# Patient Record
Sex: Male | Born: 1955 | Race: White | Hispanic: No | Marital: Single | Smoking: Current every day smoker
Health system: Southern US, Community
[De-identification: ages and names within clinical notes are randomized; demographics above are authoritative.]

## PROBLEM LIST (undated history)

## (undated) DIAGNOSIS — R42 Dizziness and giddiness: Secondary | ICD-10-CM

## (undated) DIAGNOSIS — K219 Gastro-esophageal reflux disease without esophagitis: Secondary | ICD-10-CM

## (undated) DIAGNOSIS — I509 Heart failure, unspecified: Secondary | ICD-10-CM

## (undated) DIAGNOSIS — Z972 Presence of dental prosthetic device (complete) (partial): Secondary | ICD-10-CM

## (undated) DIAGNOSIS — K7689 Other specified diseases of liver: Secondary | ICD-10-CM

## (undated) DIAGNOSIS — F1020 Alcohol dependence, uncomplicated: Secondary | ICD-10-CM

## (undated) DIAGNOSIS — I10 Essential (primary) hypertension: Secondary | ICD-10-CM

## (undated) DIAGNOSIS — R911 Solitary pulmonary nodule: Secondary | ICD-10-CM

## (undated) DIAGNOSIS — J45909 Unspecified asthma, uncomplicated: Secondary | ICD-10-CM

## (undated) DIAGNOSIS — I719 Aortic aneurysm of unspecified site, without rupture: Secondary | ICD-10-CM

## (undated) DIAGNOSIS — K859 Acute pancreatitis without necrosis or infection, unspecified: Secondary | ICD-10-CM

## (undated) HISTORY — DX: Gastro-esophageal reflux disease without esophagitis: K21.9

## (undated) HISTORY — PX: FOOT FRACTURE SURGERY: SHX645

## (undated) HISTORY — PX: SHOULDER SURGERY: SHX246

---

## 2009-03-28 ENCOUNTER — Emergency Department (HOSPITAL_COMMUNITY): Admission: EM | Admit: 2009-03-28 | Discharge: 2009-03-28 | Payer: Self-pay | Admitting: Emergency Medicine

## 2009-03-30 ENCOUNTER — Emergency Department (HOSPITAL_COMMUNITY): Admission: EM | Admit: 2009-03-30 | Discharge: 2009-03-30 | Payer: Self-pay | Admitting: Emergency Medicine

## 2009-09-07 ENCOUNTER — Emergency Department (HOSPITAL_COMMUNITY): Admission: EM | Admit: 2009-09-07 | Discharge: 2009-09-07 | Payer: Self-pay | Admitting: Emergency Medicine

## 2010-04-11 LAB — BASIC METABOLIC PANEL
BUN: 13 mg/dL (ref 6–23)
Creatinine, Ser: 0.83 mg/dL (ref 0.4–1.5)
Glucose, Bld: 83 mg/dL (ref 70–99)
Potassium: 3.5 mEq/L (ref 3.5–5.1)

## 2016-12-21 ENCOUNTER — Other Ambulatory Visit: Payer: Self-pay | Admitting: Pediatrics

## 2016-12-21 ENCOUNTER — Ambulatory Visit
Admission: RE | Admit: 2016-12-21 | Discharge: 2016-12-21 | Disposition: A | Discharge status: Home / Self Care | Payer: Disability Insurance | Source: Ambulatory Visit | Attending: Pediatrics | Admitting: Pediatrics

## 2016-12-21 DIAGNOSIS — K859 Acute pancreatitis without necrosis or infection, unspecified: Secondary | ICD-10-CM | POA: Diagnosis present

## 2016-12-21 DIAGNOSIS — I708 Atherosclerosis of other arteries: Secondary | ICD-10-CM | POA: Insufficient documentation

## 2019-07-07 IMAGING — CR DG HIP (WITH OR WITHOUT PELVIS) 2-3V*L*
1 series · 3 of 3 positions shown · non-contrast
Comparison: None.

CLINICAL DATA: Pain, chronic

EXAM:
DG HIP (WITH OR WITHOUT PELVIS) 2-3V LEFT

[Series 1: dg hip unilat w or w/o pelvis 2-3 views  · non-contrast · 0.14mm/px · 3 of 3 slices shown]
[im 1/3]
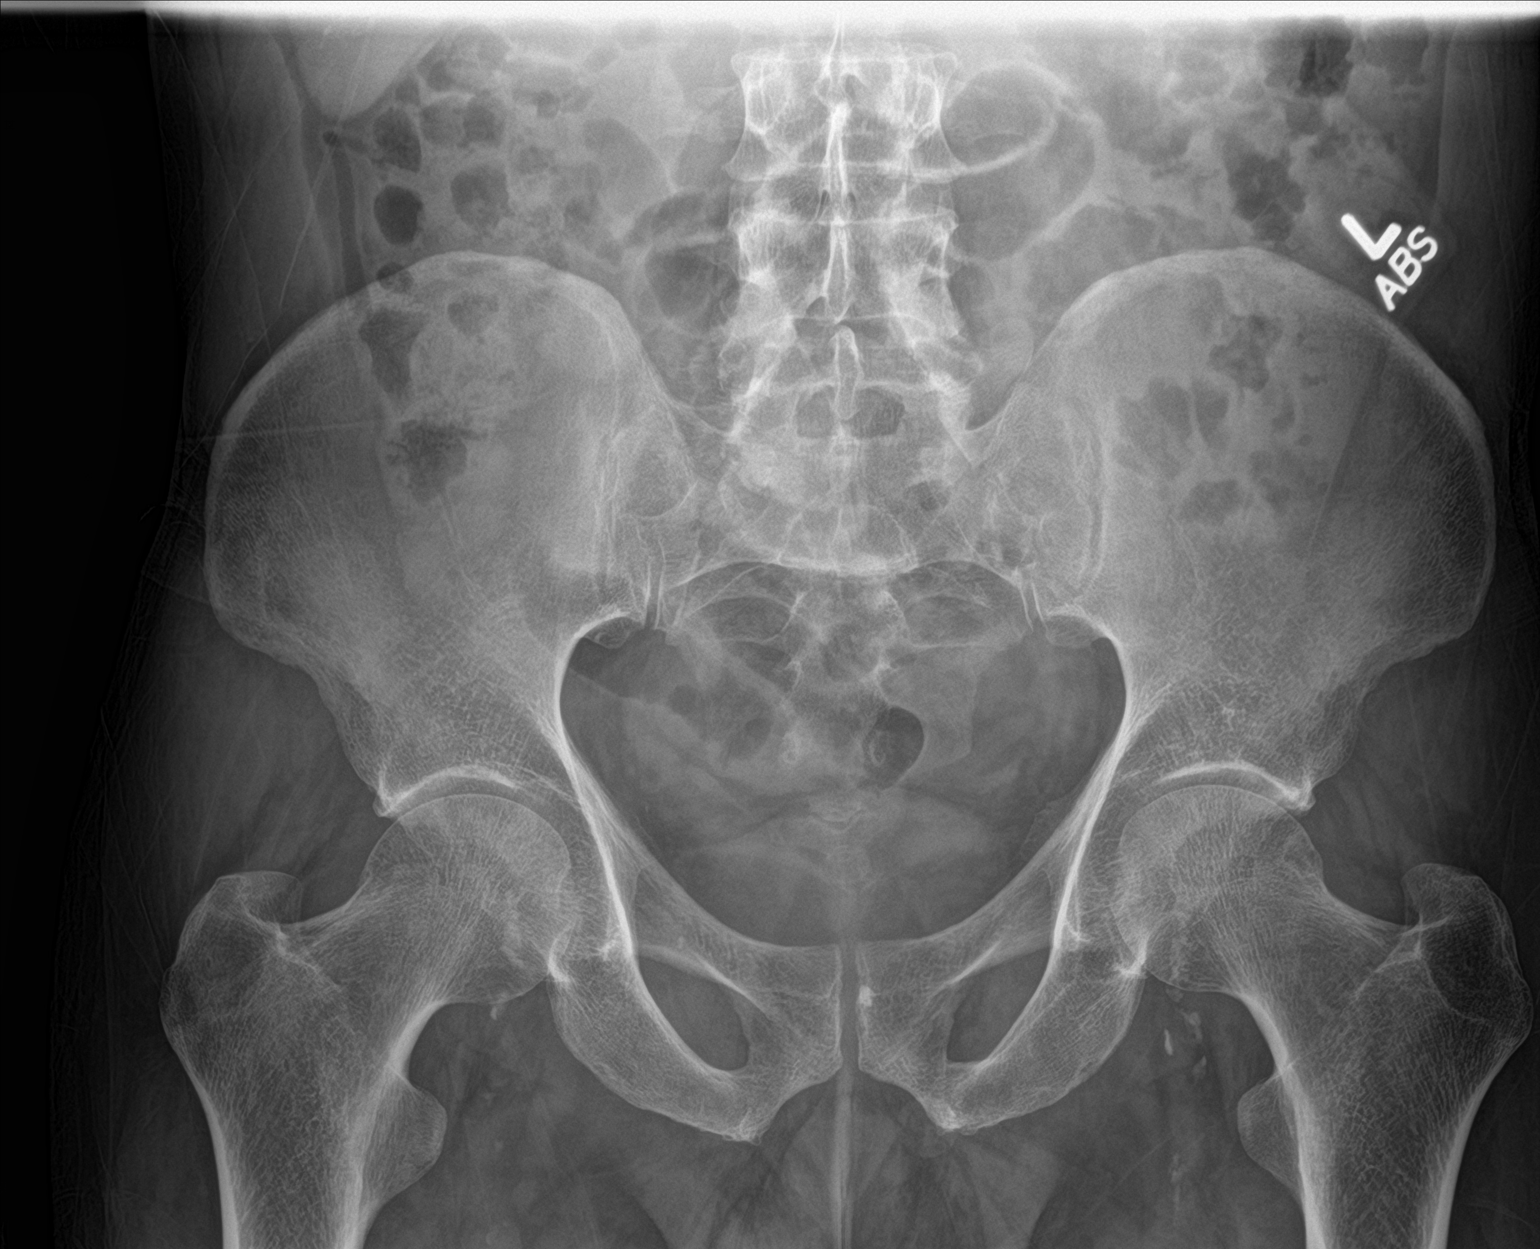
[im 2/3]
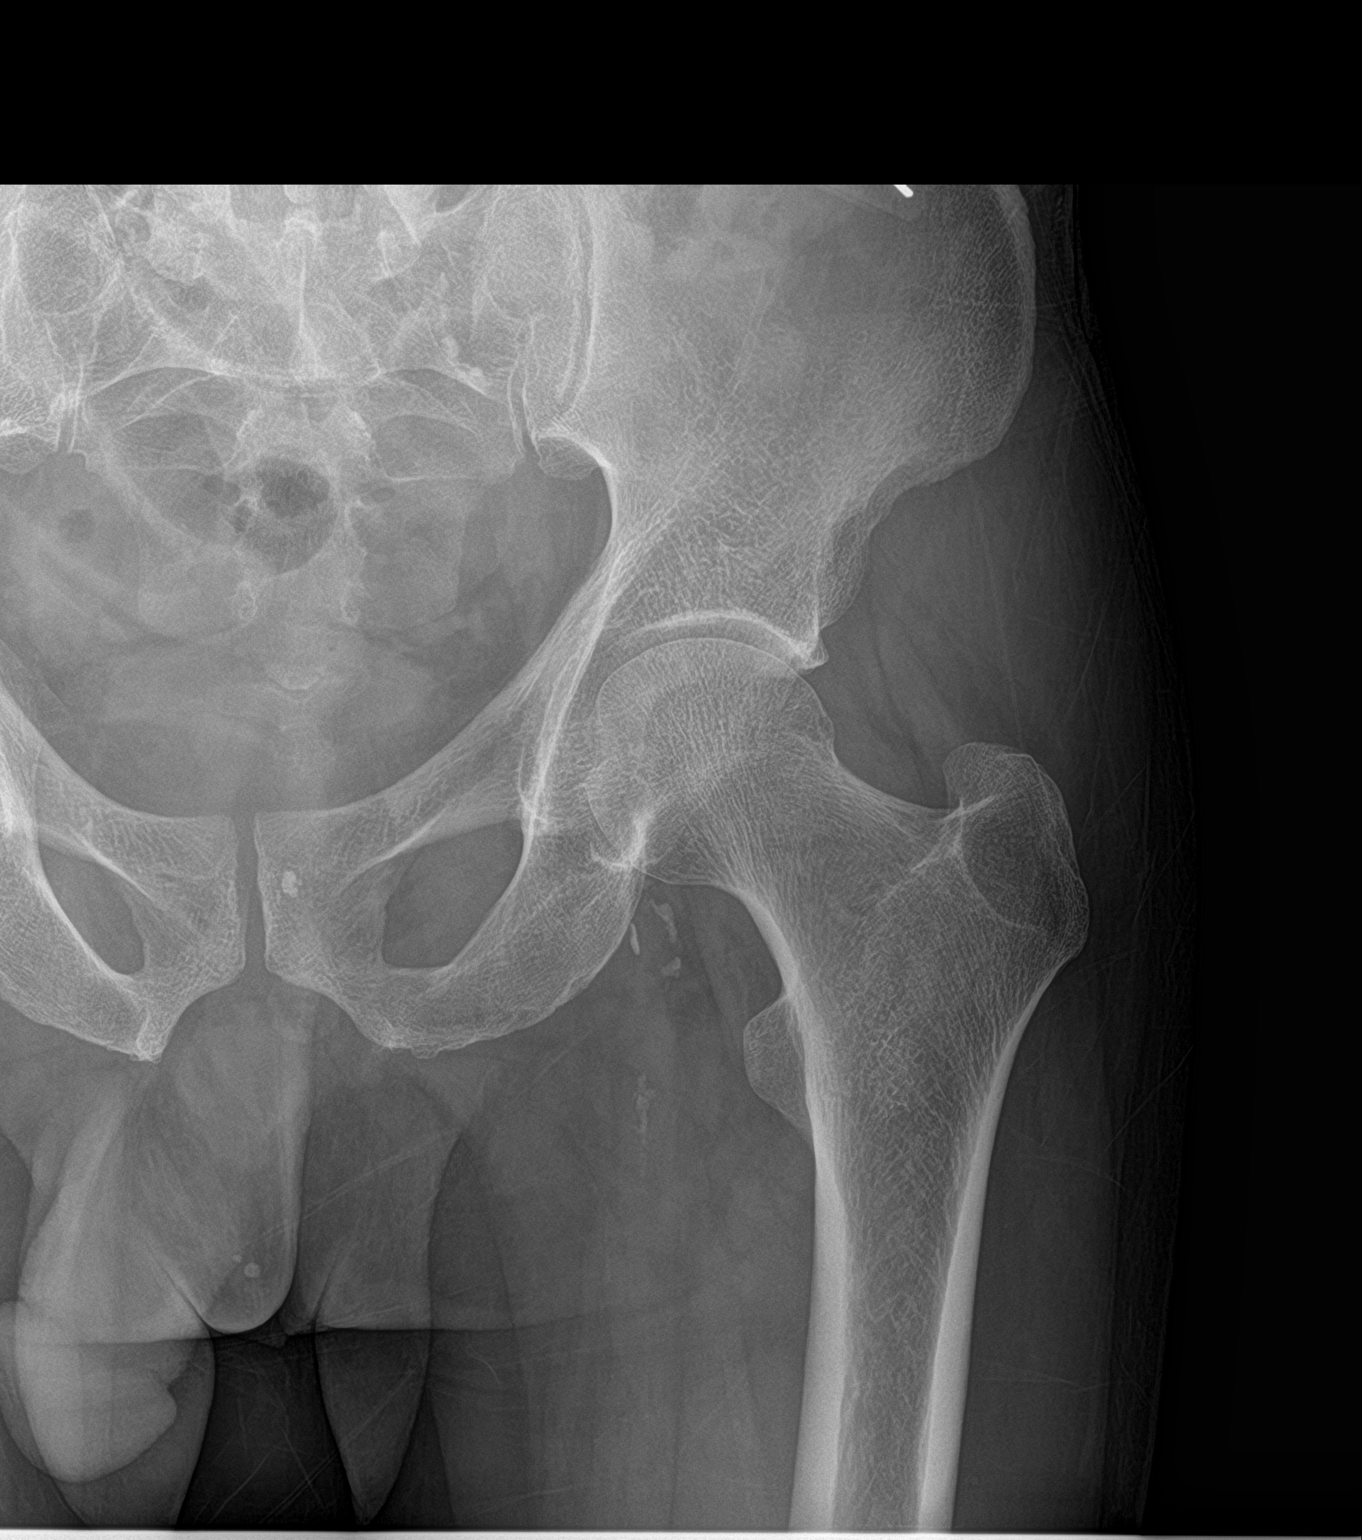
[im 3/3]
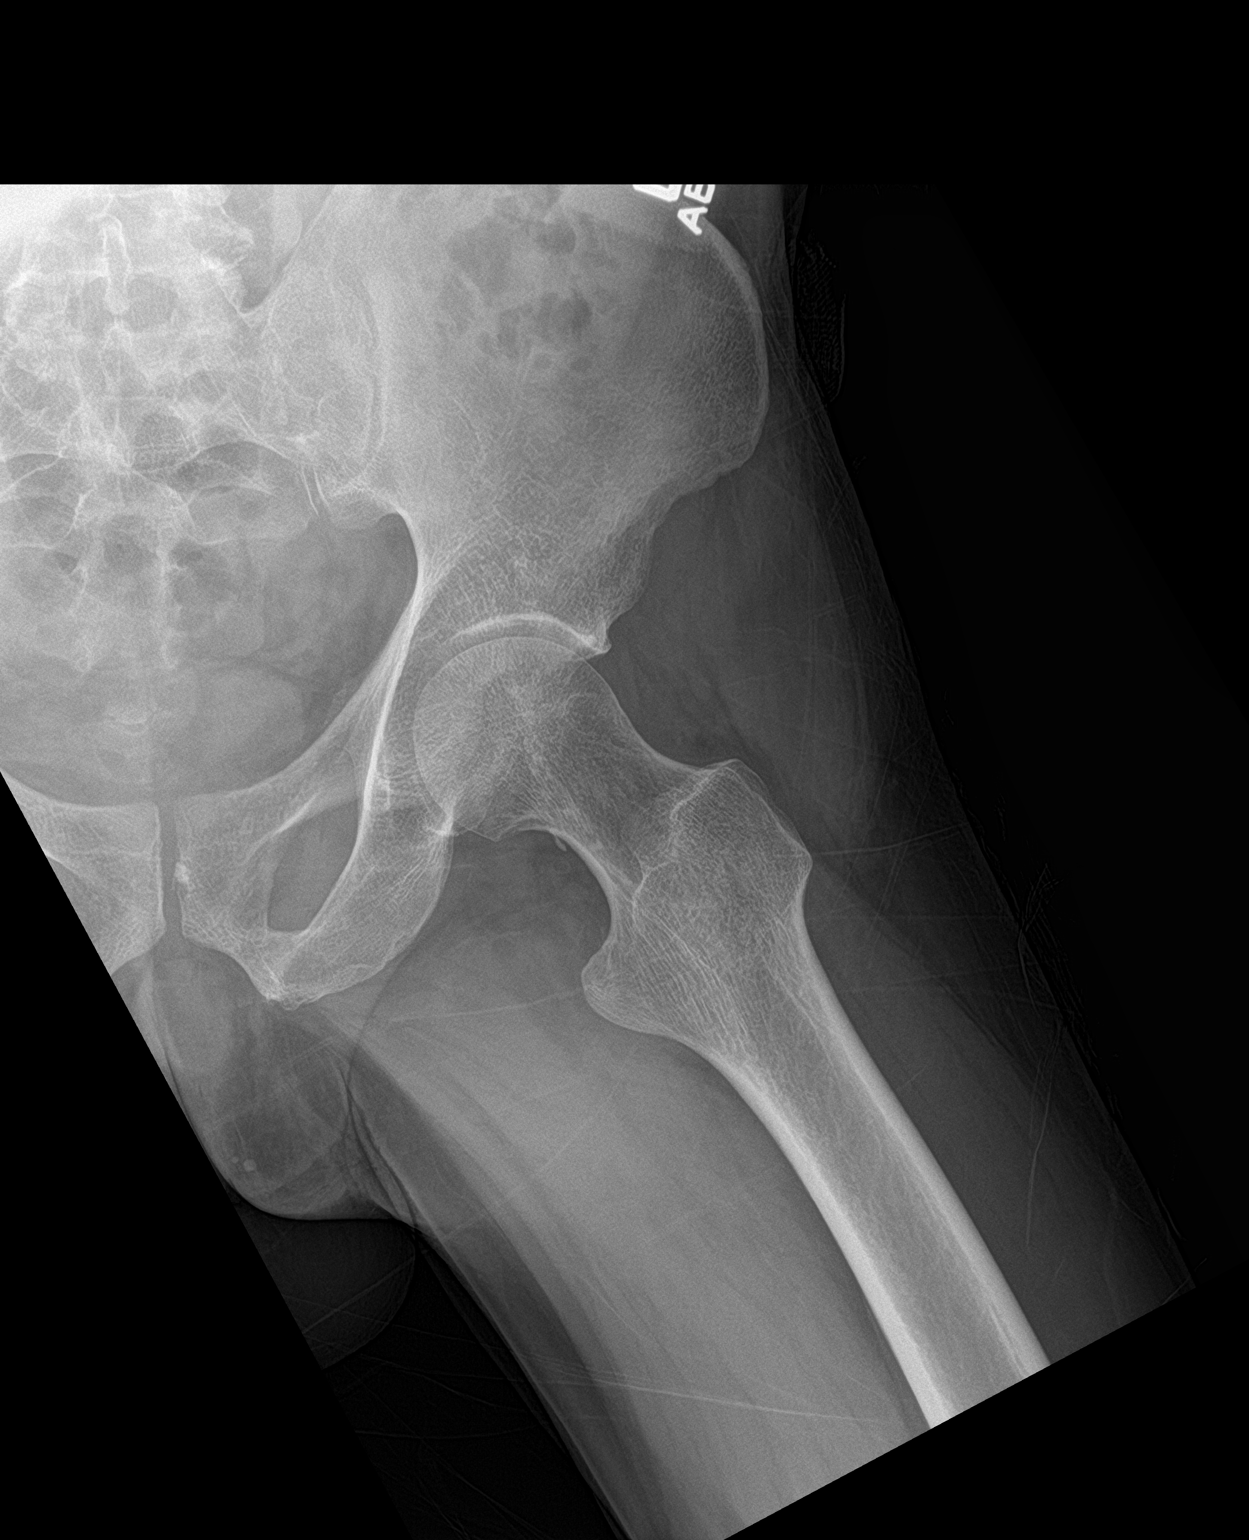

[3 of 3 positions shown; findings below may reference images not displayed]

FINDINGS: Frontal pelvis as well as frontal and lateral left hip images were
obtained. No fracture or dislocation. Joint spaces appear normal. No
erosive change. There is calcification in both proximal superficial
femoral arteries.
IMPRESSION: Areas of arterial vascular calcification. No fracture or
dislocation. No appreciable arthropathy.

## 2019-07-07 IMAGING — CR DG SHOULDER 2+V*L*
1 series · 3 of 3 positions shown · non-contrast
Comparison: None.

CLINICAL DATA: Pain

EXAM:
LEFT SHOULDER - 2+ VIEW

[Series 1: dg shoulder left · 0.14mm/px · 3 of 3 slices shown]
[im 1/3]
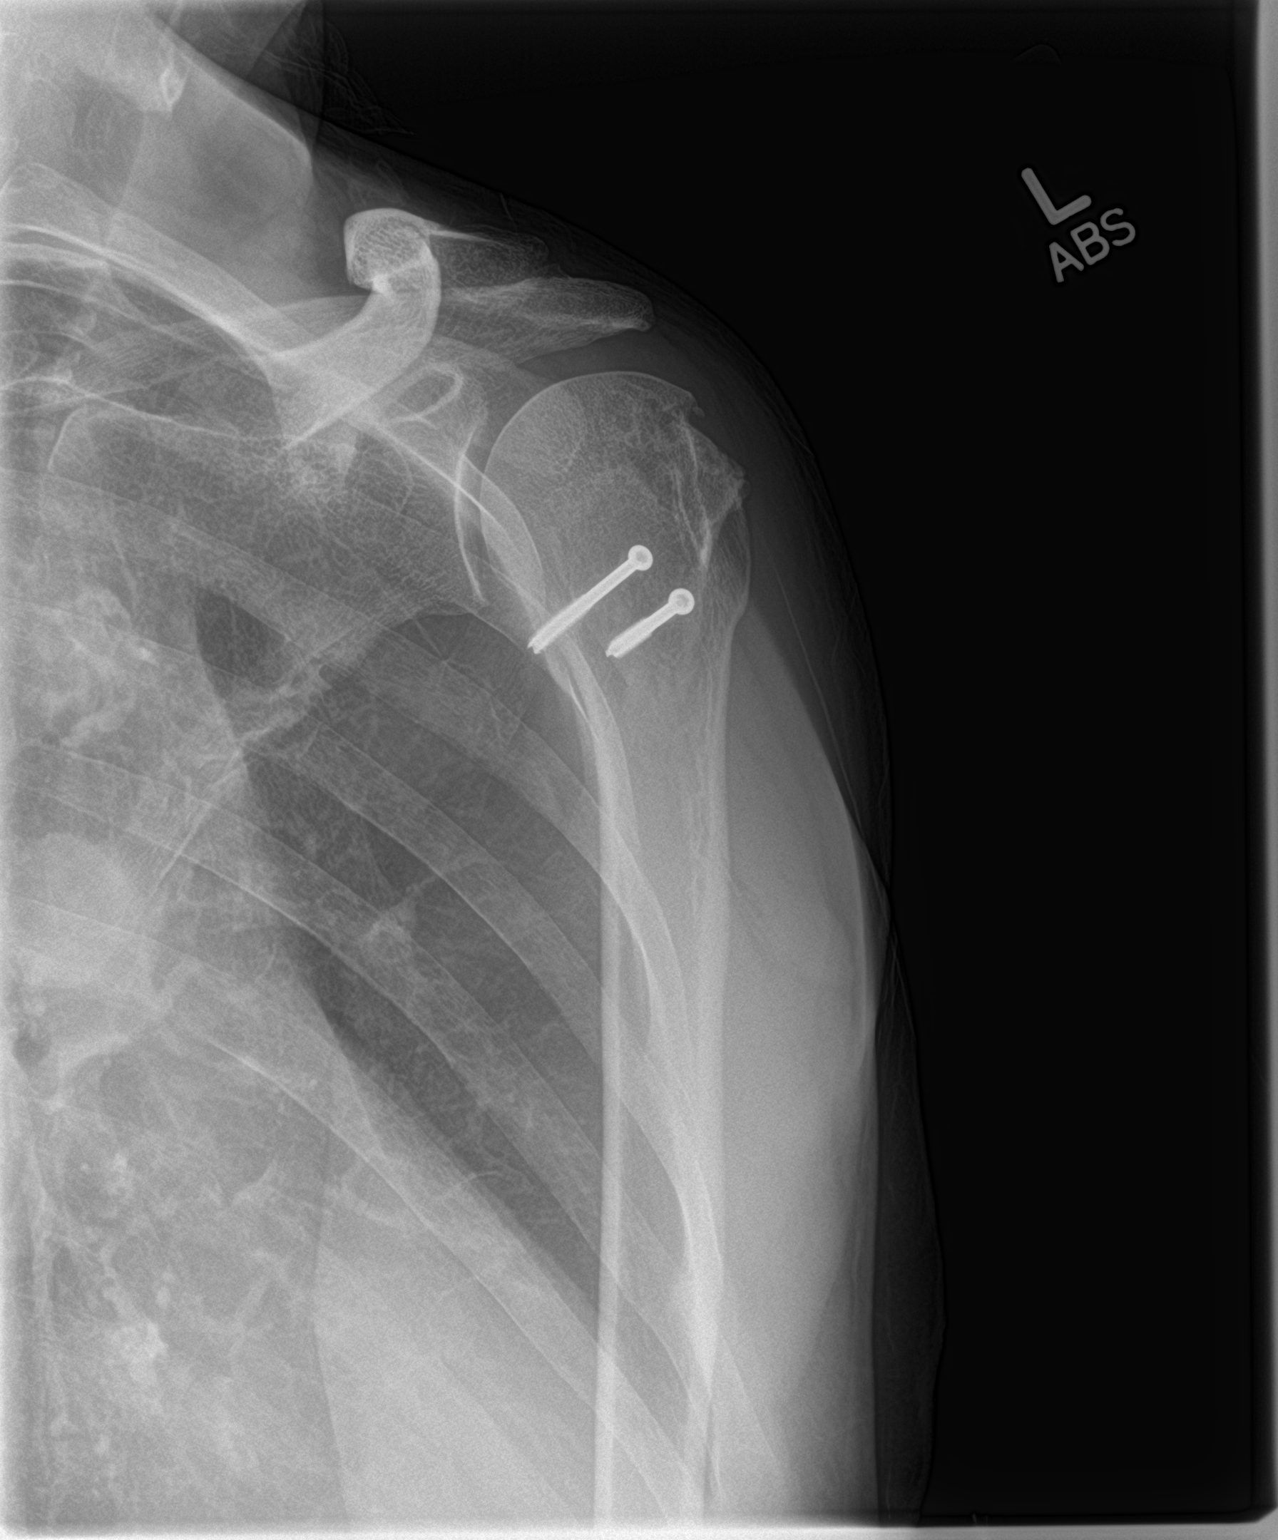
[im 2/3]
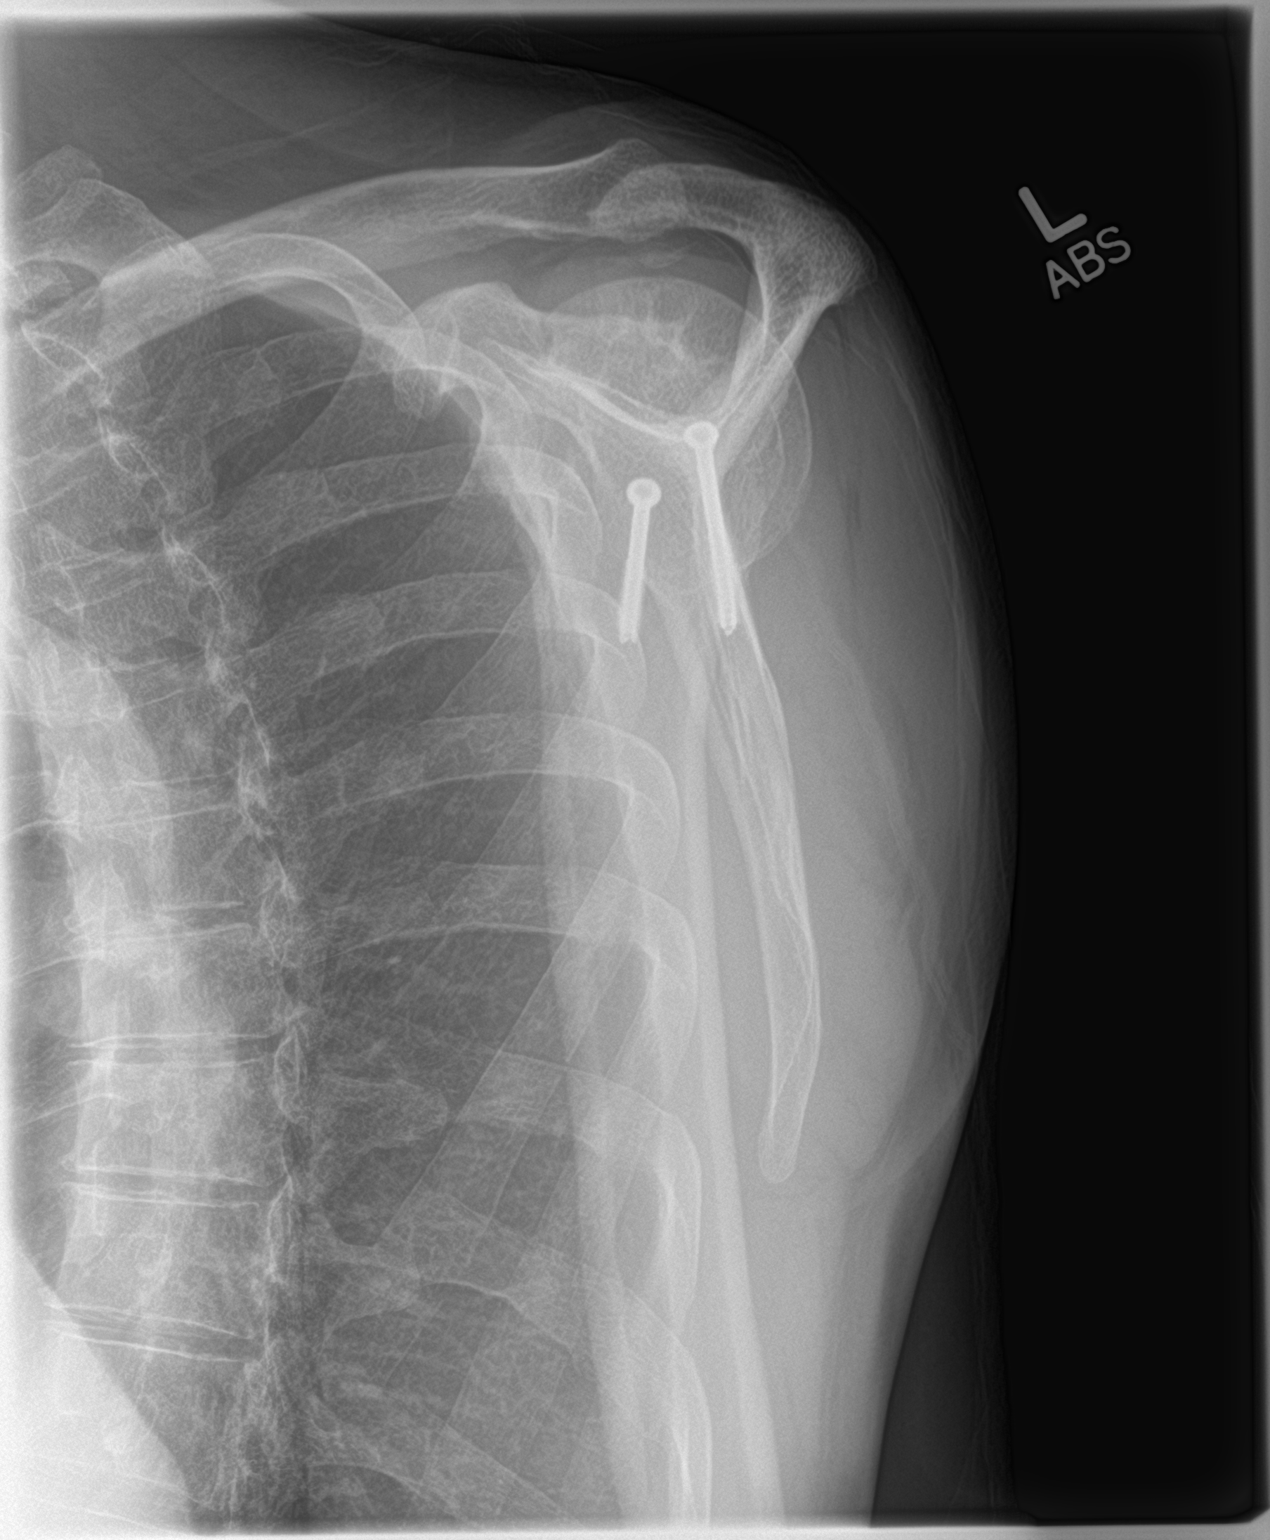
[im 3/3]
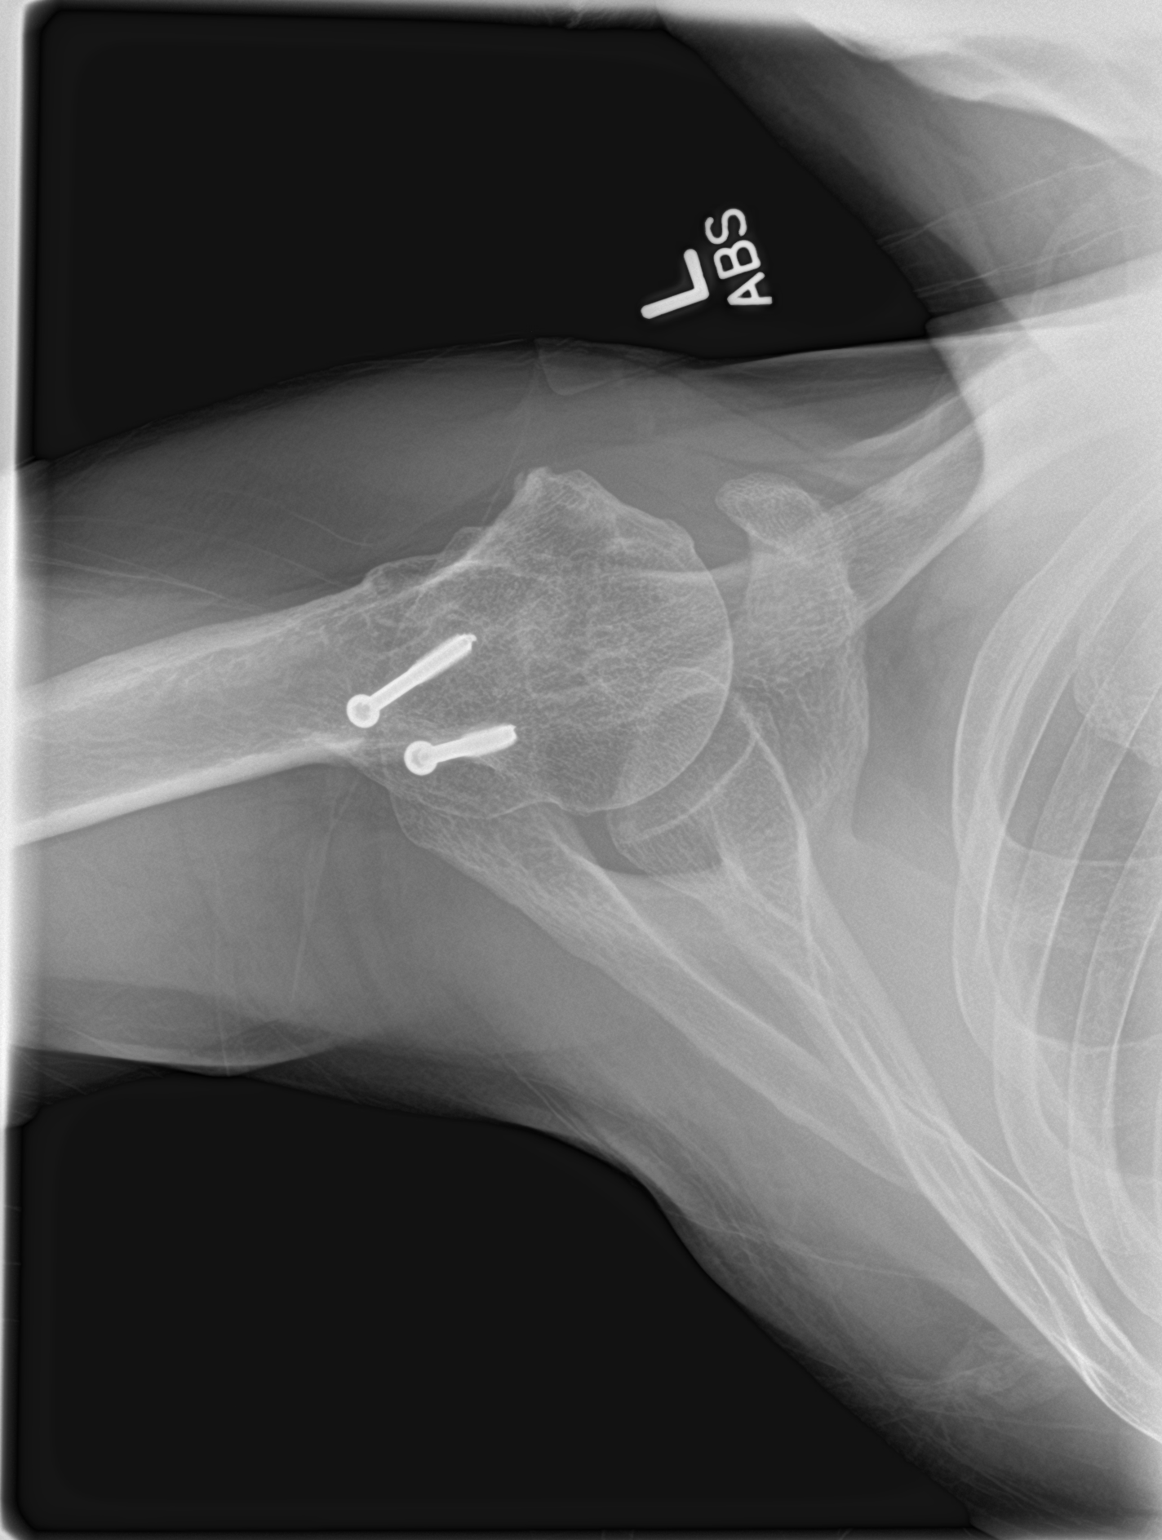

[3 of 3 positions shown; findings below may reference images not displayed]

FINDINGS: Oblique, Y scapular, and axillary images were obtained. There are
screws transfixing the proximal humeral metaphysis with alignment
anatomic. There is evidence of a prior fracture along the lateral
humeral head, likely a chronic Hill-Sachs type defect.

No acute fracture or dislocation is evident. There is no appreciable
joint space narrowing or erosion. Visualized left lung is clear.
IMPRESSION: No acute appearing fracture. No dislocation. No appreciable
arthropathy.

## 2020-10-12 ENCOUNTER — Encounter: Payer: Self-pay | Admitting: Internal Medicine

## 2020-12-01 ENCOUNTER — Ambulatory Visit: Payer: Disability Insurance | Admitting: Gastroenterology

## 2021-05-02 ENCOUNTER — Encounter: Payer: Self-pay | Admitting: Internal Medicine

## 2021-05-02 ENCOUNTER — Ambulatory Visit (INDEPENDENT_AMBULATORY_CARE_PROVIDER_SITE_OTHER): Payer: Medicare Other | Admitting: Internal Medicine

## 2021-05-02 DIAGNOSIS — F17210 Nicotine dependence, cigarettes, uncomplicated: Secondary | ICD-10-CM

## 2021-05-02 DIAGNOSIS — J449 Chronic obstructive pulmonary disease, unspecified: Secondary | ICD-10-CM | POA: Insufficient documentation

## 2021-05-02 DIAGNOSIS — R911 Solitary pulmonary nodule: Secondary | ICD-10-CM | POA: Diagnosis not present

## 2021-05-02 MED ORDER — ALBUTEROL SULFATE HFA 108 (90 BASE) MCG/ACT IN AERS: INHALATION_SPRAY | RESPIRATORY_TRACT | Status: DC

## 2021-05-02 NOTE — Assessment & Plan Note (Signed)
Counseled re importance of smoking cessation but did not meet time criteria for separate billing   ? ? ?Will need baseline pfts on return in 3 m but I don't believe he has significant copd at this point. ? ?    ?  ? ?Each maintenance medication was reviewed in detail including emphasizing most importantly the difference between maintenance and prns and under what circumstances the prns are to be triggered using an action plan format where appropriate. ? ?Total time for H and P, chart review, counseling, reviewing hfa device(s) and generating customized AVS unique to this office visit / same day charting = 45 min  ?     ?

## 2021-05-02 NOTE — Assessment & Plan Note (Signed)
CT2/19/23   8 mm GG RUL at [facility] in [place]  ? ?CT results reviewed with pt >>> Too small for PET or bx, not suspicious enough for excisional bx > really only option for now is follow the Fleischner society guidelines as rec by radiology.=  6 months reasonable, esp since it it not a solid nodule. ? ?Says plans to see a Dr Yong Channel at [facility] in meantime but doesn't know what the plan is. I suggested he keep his f/u in one system where whoever is following the lesion has access to all the CT's virtually for ease /continuity of care. ?

## 2021-05-02 NOTE — Progress Notes (Signed)
? ?Jason Brooks., male    DOB: [birth year],    MRN: [id number] ? ? ?Brief patient profile:  ?65  yowm from [place]  active smoeker  referred to pulmonary clinic in [place]  05/02/2021 by Suzzanne Cloud NP   for spn in pt  = 8 mm GG RUL at [facility] in [place] CT2/19/23  ? ? ? ? ?History of Present Illness  ?05/02/2021  Pulmonary/ 1st office eval/ Melvyn Novas / [facility]  ?Chief Complaint  ?Patient presents with  ? Consult  ?  Appt for lung nodule noted on ct scan no sob or trouble breathing. Has rescue inhaler not prescribed to patient that he uses once a day  ?Dyspnea:  walks to grocery store x 4 miles and back includes hills ?Cough: none  ?Sleep: flat bed on side/ one pillow  ?SABA use: 1st thing in am daily then nothing else the rest of the day  ? ?No obvious day to day or daytime variability or assoc excess/ purulent sputum or mucus plugs or hemoptysis or cp or chest tightness, subjective wheeze or overt sinus or hb symptoms.  ? ?Sleeping as above without nocturnal  or early am exacerbation  of respiratory  c/o's or need for noct saba. Also denies any obvious fluctuation of symptoms with weather or environmental changes or other aggravating or alleviating factors except as outlined above  ? ?No unusual exposure hx or h/o childhood pna/ asthma or knowledge of premature birth. ? ?Current Allergies, Complete Past Medical History, Past Surgical History, Family History, and Social History were reviewed in [employer] record. ? ?ROS  The following are not active complaints unless bolded ?Hoarseness, sore throat, dysphagia, dental problems, itching, sneezing,  nasal congestion or discharge of excess mucus or purulent secretions, ear ache,   fever, chills, sweats, unintended wt loss or wt gain, classically pleuritic or exertional cp,  orthopnea pnd or arm/hand swelling  or leg swelling, presyncope, palpitations, abdominal pain, anorexia, nausea, vomiting, diarrhea  or change in bowel habits or  change in bladder habits, change in stools or change in urine, dysuria, hematuria,  rash, arthralgias, visual complaints, headache, numbness, weakness or ataxia or problems with walking or coordination,  change in mood or  memory. ?      ?   ? ?History reviewed. No pertinent past medical history. ? ?Outpatient Medications Prior to Visit  ?Medication Sig Dispense Refill  ? atorvastatin (LIPITOR) 20 MG tablet Take 20 mg by mouth daily.    ?Albuterol 2pffs each am (not prn)  ? ? ?Objective:  ?  ? ?BP (!) 142/88 (BP Location: Left Arm, Patient Position: Sitting)   Pulse 62   Temp 98.7 ?F (37.1 ?C) (Temporal)   Ht '5\' 8"'$  (1.727 m)   Wt 141 lb (64 kg)   SpO2 98% Comment: ra  BMI 21.44 kg/m?  ? ?SpO2: 98 % (ra) ? ?HEENT : edentulous   ? ?NECK :  without JVD/Nodes/TM/ nl carotid upstrokes bilaterally ? ? ?LUNGS: no acc muscle use,  Min barrel  contour chest wall with bilateral  slightly decreased bs s audible wheeze and  without cough on insp or exp maneuvers and min  Hyperresonant  to  percussion bilaterally   ? ? ?CV:  RRR  no s3 or murmur or increase in P2, and no edema  ? ?ABD:  soft and nontender with pos end  insp Hoover's  in the supine position. No bruits or organomegaly appreciated, bowel sounds nl ? ?MS:  Nl gait/  ext warm without deformities, calf tenderness, cyanosis or clubbing ?No obvious joint restrictions  ? ?SKIN: warm and dry without lesions   ? ?NEURO:  alert, approp, nl sensorium with  no motor or cerebellar deficits apparent.  ?    ? ? ?   ?Assessment  ? ?COPD GOLD?  / active smoker  ?Active smoker / Group A symtpoms ?- 05/02/2021  After extensive coaching inhaler device,  effectiveness =    80% so continue saba prn ? ?Only use your albuterol as a rescue medication to be used if you can't catch your breath by resting or doing a relaxed purse lip breathing pattern.  ?- The less you use it, the better it will work when you need it. ?- Ok to use up to 2 puffs  every 4 hours if you must but call for  immediate appointment if use goes up over your usual need ?- Don't leave home without it !!  (think of it like the spare tire for your car)  ? ?rec also Ok to try albuterol 15 min before an activity (on alternating days)  that you know would usually make you short of breath and see if it makes any difference and if makes none then don't take albuterol after activity unless you can't catch your breath as this means it's the resting that helps, not the albuterol. ?     ? ? ? ?Solitary pulmonary nodule on lung CT ?CT2/19/23   8 mm GG RUL at [facility] in [place]  ? ?CT results reviewed with pt >>> Too small for PET or bx, not suspicious enough for excisional bx > really only option for now is follow the Fleischner society guidelines as rec by radiology.=  6 months reasonable, esp since it it not a solid nodule. ? ?Says plans to see a Dr Yong Channel at [facility] in meantime but doesn't know what the plan is. I suggested he keep his f/u in one system where whoever is following the lesion has access to all the CT's virtually for ease /continuity of care. ? ? ? ?Cigarette smoker ?Counseled re importance of smoking cessation but did not meet time criteria for separate billing   ? ? ?Will need baseline pfts on return in 3 m but I don't believe he has significant copd at this point. ? ?    ? ?Each maintenance medication was reviewed in detail including emphasizing most importantly the difference between maintenance and prns and under what circumstances the prns are to be triggered using an action plan format where appropriate. ? ?Total time for H and P, chart review, counseling, reviewing hfa device(s) and generating customized AVS unique to this office visit / same day charting = 45 min  ?     ? ? ?Christinia Gully, MD ?05/02/2021 ?   ?

## 2021-05-02 NOTE — Assessment & Plan Note (Signed)
Active smoker / Group A symtpoms ?- 05/02/2021  After extensive coaching inhaler device,  effectiveness =    80% so continue saba prn ? ?Only use your albuterol as a rescue medication to be used if you can't catch your breath by resting or doing a relaxed purse lip breathing pattern.  ?- The less you use it, the better it will work when you need it. ?- Ok to use up to 2 puffs  every 4 hours if you must but call for immediate appointment if use goes up over your usual need ?- Don't leave home without it !!  (think of it like the spare tire for your car)  ? ?rec also Ok to try albuterol 15 min before an activity (on alternating days)  that you know would usually make you short of breath and see if it makes any difference and if makes none then don't take albuterol after activity unless you can't catch your breath as this means it's the resting that helps, not the albuterol. ?     ? ? ?

## 2021-05-02 NOTE — Patient Instructions (Addendum)
Only use your albuterol as a rescue medication to be used if you can't catch your breath by resting or doing a relaxed purse lip breathing pattern.  ?- The less you use it, the better it will work when you need it. ?- Ok to use up to 2 puffs  every 4 hours if you must but call for immediate appointment if use goes up over your usual need ?- Don't leave home without it !!  (think of it like the spare tire for your car)  ? ?Ok to try albuterol 15 min before an activity (on alternating days)  that you know would usually make you short of breath and see if it makes any difference and if makes none then don't take albuterol after activity unless you can't catch your breath as this means it's the resting that helps, not the albuterol. ?    ? ?The key is to stop smoking completely before smoking completely stops you! ?  ?   ?PFT's on return ? ?Please schedule a follow up visit in 3 months but call sooner if needed  with all medications /inhalers/ solutions in hand so we can verify exactly what you are taking. This includes all medications from all doctors and over the counter ?

## 2021-05-10 ENCOUNTER — Telehealth: Payer: Self-pay | Admitting: Internal Medicine

## 2021-05-10 ENCOUNTER — Other Ambulatory Visit: Payer: Self-pay

## 2021-05-10 ENCOUNTER — Telehealth: Payer: Self-pay

## 2021-05-10 ENCOUNTER — Ambulatory Visit (INDEPENDENT_AMBULATORY_CARE_PROVIDER_SITE_OTHER): Payer: Medicare Other | Admitting: Gastroenterology

## 2021-05-10 ENCOUNTER — Encounter: Payer: Self-pay | Admitting: Gastroenterology

## 2021-05-10 DIAGNOSIS — R1013 Epigastric pain: Secondary | ICD-10-CM

## 2021-05-10 DIAGNOSIS — Z1211 Encounter for screening for malignant neoplasm of colon: Secondary | ICD-10-CM

## 2021-05-10 MED ORDER — PEG 3350-KCL-NA BICARB-NACL 420 G PO SOLR: 4000.0000 mL | ORAL | 0 refills | Status: DC

## 2021-05-10 MED ORDER — ALBUTEROL SULFATE HFA 108 (90 BASE) MCG/ACT IN AERS: INHALATION_SPRAY | RESPIRATORY_TRACT | 6 refills | Status: AC

## 2021-05-10 NOTE — Telephone Encounter (Signed)
[phone]  PLEASE CALL PATIENT. HE HAS A QUESTION ABOUT SOMETHING ON HIS DISCHARGE SHEET  ?

## 2021-05-10 NOTE — Progress Notes (Signed)
? ? ? ? ? ?Gastroenterology Office Note   ? ?Referring Provider: Coolidge Breeze, FNP ?Primary Care Physician:  Coolidge Breeze, FNP  ?Primary GI: Dr. Abbey Chatters ? ? ?Chief Complaint  ? ?Chief Complaint  ?Patient presents with  ? New Patient (Initial Visit)  ?  Referred for pancreatitis  ? ? ? ?History of Present Illness  ? ?Jason Bathe. is a 66 y.o. male presenting today at the request of Coolidge Breeze, FNP due to history of pancreatitis. Minimal records available at visit. After visit, I was able to retrieve outside imaging from [facility] dated Feb 2023. CT findings of acute on chronic pancreatitis. Also noted right upper lobe lung nodule; pulmonology is on board. MRI then completed a month later with resolution of pancreatitis, numerous benign-appearing liver cysts, simple renal cysts bilaterally.  ? ? ?He notes since 2011, he has had  3-4 pancreatitis attacks. Drinks ETOH daily. In the past was drinking 24 beers a day, now drinking a 6 pack. ? ?Takes Prevacid as needed for epigastric pain. About once a week. Takes Aleve for joint pain.  No overt GI bleeding. No constipation/ diarrhea. No unexplained weight loss or lack of appetite. Needs screening colonoscopy.  ? ?Sept 2022 triglycerides high at 364, total cholesterol 213,  ? ? ? ?Past Medical History:  ?Diagnosis Date  ? GERD (gastroesophageal reflux disease)   ? ? ?History reviewed. No pertinent surgical history. ? ?Current Outpatient Medications  ?Medication Sig Dispense Refill  ? atorvastatin (LIPITOR) 20 MG tablet Take 20 mg by mouth daily.    ? albuterol (PROAIR HFA) 108 (90 Base) MCG/ACT inhaler 2 puffs every 4 hours as needed only  if your can't catch your breath (Patient taking differently: 2 puffs every 4 (four) hours as needed. 2 puffs every 4 hours as needed only  if your can't catch your breath) 18 g 6  ? ibuprofen (ADVIL) 200 MG tablet Take 400-600 mg by mouth every 6 (six) hours as needed for mild pain, moderate pain or headache.    ?  metoprolol tartrate (LOPRESSOR) 25 MG tablet Take 25 mg by mouth 2 (two) times daily.    ? omeprazole (PRILOSEC) 20 MG capsule Take 20 mg by mouth daily.    ? polyethylene glycol-electrolytes (TRILYTE) 420 g solution Take 4,000 mLs by mouth as directed. 4000 mL 0  ? ?No current facility-administered medications for this visit.  ? ? ?Allergies as of 05/10/2021 - Review Complete 05/10/2021  ?Allergen Reaction Noted  ? Codeine Hives 03/10/2021  ? ? ?Family History  ?Problem Relation Age of Onset  ? Colon cancer Neg Hx   ? Colon polyps Neg Hx   ? ? ?Social History  ? ?Socioeconomic History  ? Marital status: Single  ?  Spouse name: Not on file  ? Number of children: Not on file  ? Years of education: Not on file  ? Highest education level: Not on file  ?Occupational History  ? Not on file  ?Tobacco Use  ? Smoking status: Every Day  ?  Packs/day: 0.50  ?  Types: Cigarettes  ? Smokeless tobacco: Not on file  ?Substance and Sexual Activity  ? Alcohol use: Yes  ? Drug use: Never  ? Sexual activity: Not on file  ?Other Topics Concern  ? Not on file  ?Social History Narrative  ? Not on file  ? ?Social Determinants of Health  ? ?Financial Resource Strain: Not on file  ?Food Insecurity: Not on file  ?  Transportation Needs: Not on file  ?Physical Activity: Not on file  ?Stress: Not on file  ?Social Connections: Not on file  ?Intimate Partner Violence: Not on file  ? ? ? ?Review of Systems  ? ?Gen: Denies any fever, chills, fatigue, weight loss, lack of appetite.  ?CV: Denies chest pain, heart palpitations, peripheral edema, syncope.  ?Resp: Denies shortness of breath at rest or with exertion. Denies wheezing or cough.  ?GI: see HPI ?GU : Denies urinary burning, urinary frequency, urinary hesitancy ?MS: Denies joint pain, muscle weakness, cramps, or limitation of movement.  ?Derm: Denies rash, itching, dry skin ?Psych: Denies depression, anxiety, memory loss, and confusion ?Heme: Denies bruising, bleeding, and enlarged lymph  nodes. ? ? ?Physical Exam  ? ?BP 124/74   Pulse 60   Temp (!) 97.5 ?F (36.4 ?C)   Ht '5\' 8"'$  (1.727 m)   Wt 141 lb (64 kg)   BMI 21.44 kg/m?  ?General:   Alert and oriented. Pleasant and cooperative. Well-nourished and well-developed.  ?Head:  Normocephalic and atraumatic. ?Eyes:  Without icterus ?Ears:  Normal auditory acuity. ?Lungs:  Clear to auscultation bilaterally.  ?Heart:  S1, S2 present with systolic murmur ?Abdomen:  +BS, soft, non-tender and non-distended. No HSM noted. No guarding or rebound. No masses appreciated.  ?Rectal:  Deferred  ?Msk:  Symmetrical without gross deformities. Normal posture. ?Extremities:  Without edema. ?Neurologic:  Alert and  oriented x4;  grossly normal neurologically. ?Skin:  Intact without significant lesions or rashes. ?Psych:  Alert and cooperative. Normal mood and affect. ? ? ?Assessment  ? ?Jason Banas. is a 66 y.o. male presenting today as a new patient at request of PCP due to history of pancreatitis, reporting 3-4 episodes since 2011 and most recently Feb 2023.  ? 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 Although triglycerides elevated (364) in sept 2022, most likely ETOH-induced. Encouragingly, MRI without any occult mass or lesions of pancreas.  ? ?He does note epigastric pain, taking PPI only as needed. Continues with Aleve. We discussed EGD to be thorough due to dyspepsia. Could also consider pancreatic enzymes as likely has chronic pancreatitis.   ? ?Will arrange colonoscopy at time of EGD.  ? ?PLAN  ? ? ?Proceed with colonoscopy/EGD by Dr. Abbey Chatters  in near future: the risks, benefits, and alternatives have been discussed with the patient in detail. The patient states understanding and desires to proceed.  ? ?PPI daily instead of prn ? ?Follow-up thereafter ? ?Consider pancreatic enzymes ? ?ETOH and smoking cessation recommended ? ?Follow-up of pulmonary nodule with  Pulmonary ? ?Annitta Needs, PhD, ANP-BC ?[facility]  ? ? ?

## 2021-05-10 NOTE — Telephone Encounter (Signed)
Pre-op appt 06/02/21. Appt letter mailed with procedure instructions. ?

## 2021-05-10 NOTE — Telephone Encounter (Signed)
Spoke to pt, TCS/EGD scheduled for 06/06/21 at 1:45pm. Rx for prep sent to pharmacy. Orders entered. ?

## 2021-05-10 NOTE — Patient Instructions (Signed)
We are arranging a colonoscopy and upper endoscopy by Dr. Abbey Chatters in the near future! ? ?I am trying to get all of your records from [place]. ? ?I recommend taking Prevacid every day instead of just as needed. ? ?Further recommendations to follow! ? ?It was a pleasure to see you today. I want to create trusting relationships with patients to provide genuine, compassionate, and quality care. I value your feedback. If you receive a survey regarding your visit,  I greatly appreciate you taking time to fill this out.  ? ?Annitta Needs, PhD, ANP-BC ?[facility]  ? ?

## 2021-05-10 NOTE — Addendum Note (Signed)
Addended by: Hassan Rowan on: 05/10/2021 03:24 PM ? ? Modules accepted: Orders ? ?

## 2021-05-10 NOTE — H&P (View-Only) (Signed)
Gastroenterology Office Note    Referring Provider: Coolidge Breeze, FNP Primary Care Physician:  Coolidge Breeze, FNP  Primary GI: Dr. Abbey Chatters   Chief Complaint   Chief Complaint  Patient presents with   New Patient (Initial Visit)    Referred for pancreatitis     History of Present Illness   Jason Jesus. is a 66 y.o. male presenting today at the request of Coolidge Breeze, FNP due to history of pancreatitis. Minimal records available at visit. After visit, I was able to retrieve outside imaging from [facility] dated Feb 2023. CT findings of acute on chronic pancreatitis. Also noted right upper lobe lung nodule; pulmonology is on board. MRI then completed a month later with resolution of pancreatitis, numerous benign-appearing liver cysts, simple renal cysts bilaterally.    He notes since 2011, he has had  3-4 pancreatitis attacks. Drinks ETOH daily. In the past was drinking 24 beers a day, now drinking a 6 pack.  Takes Prevacid as needed for epigastric pain. About once a week. Takes Aleve for joint pain.  No overt GI bleeding. No constipation/ diarrhea. No unexplained weight loss or lack of appetite. Needs screening colonoscopy.   Sept 2022 triglycerides high at 364, total cholesterol 213,     Past Medical History:  Diagnosis Date   GERD (gastroesophageal reflux disease)     History reviewed. No pertinent surgical history.  Current Outpatient Medications  Medication Sig Dispense Refill   atorvastatin (LIPITOR) 20 MG tablet Take 20 mg by mouth daily.     albuterol (PROAIR HFA) 108 (90 Base) MCG/ACT inhaler 2 puffs every 4 hours as needed only  if your can't catch your breath (Patient taking differently: 2 puffs every 4 (four) hours as needed. 2 puffs every 4 hours as needed only  if your can't catch your breath) 18 g 6   ibuprofen (ADVIL) 200 MG tablet Take 400-600 mg by mouth every 6 (six) hours as needed for mild pain, moderate pain or headache.      metoprolol tartrate (LOPRESSOR) 25 MG tablet Take 25 mg by mouth 2 (two) times daily.     omeprazole (PRILOSEC) 20 MG capsule Take 20 mg by mouth daily.     polyethylene glycol-electrolytes (TRILYTE) 420 g solution Take 4,000 mLs by mouth as directed. 4000 mL 0   No current facility-administered medications for this visit.    Allergies as of 05/10/2021 - Review Complete 05/10/2021  Allergen Reaction Noted   Codeine Hives 03/10/2021    Family History  Problem Relation Age of Onset   Colon cancer Neg Hx    Colon polyps Neg Hx     Social History   Socioeconomic History   Marital status: Single    Spouse name: Not on file   Number of children: Not on file   Years of education: Not on file   Highest education level: Not on file  Occupational History   Not on file  Tobacco Use   Smoking status: Every Day    Packs/day: 0.50    Types: Cigarettes   Smokeless tobacco: Not on file  Substance and Sexual Activity   Alcohol use: Yes   Drug use: Never   Sexual activity: Not on file  Other Topics Concern   Not on file  Social History Narrative   Not on file   Social Determinants of Health   Financial Resource Strain: Not on file  Food Insecurity: Not on file  Transportation Needs: Not on file  Physical Activity: Not on file  Stress: Not on file  Social Connections: Not on file  Intimate Partner Violence: Not on file     Review of Systems   Gen: Denies any fever, chills, fatigue, weight loss, lack of appetite.  CV: Denies chest pain, heart palpitations, peripheral edema, syncope.  Resp: Denies shortness of breath at rest or with exertion. Denies wheezing or cough.  GI: see HPI GU : Denies urinary burning, urinary frequency, urinary hesitancy MS: Denies joint pain, muscle weakness, cramps, or limitation of movement.  Derm: Denies rash, itching, dry skin Psych: Denies depression, anxiety, memory loss, and confusion Heme: Denies bruising, bleeding, and enlarged lymph  nodes.   Physical Exam   BP 124/74   Pulse 60   Temp (!) 97.5 F (36.4 C)   Ht '5\' 8"'$  (1.727 m)   Wt 141 lb (64 kg)   BMI 21.44 kg/m  General:   Alert and oriented. Pleasant and cooperative. Well-nourished and well-developed.  Head:  Normocephalic and atraumatic. Eyes:  Without icterus Ears:  Normal auditory acuity. Lungs:  Clear to auscultation bilaterally.  Heart:  S1, S2 present with systolic murmur Abdomen:  +BS, soft, non-tender and non-distended. No HSM noted. No guarding or rebound. No masses appreciated.  Rectal:  Deferred  Msk:  Symmetrical without gross deformities. Normal posture. Extremities:  Without edema. Neurologic:  Alert and  oriented x4;  grossly normal neurologically. Skin:  Intact without significant lesions or rashes. Psych:  Alert and cooperative. Normal mood and affect.   Assessment   Jason Brooks. is a 66 y.o. male presenting today as a new patient at request of PCP due to history of pancreatitis, reporting 3-4 episodes since 2011 and most recently Feb 2023.   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 Although triglycerides elevated (364) in sept 2022, most likely ETOH-induced. Encouragingly, MRI without any occult mass or lesions of pancreas.   He does note epigastric pain, taking PPI only as needed. Continues with Aleve. We discussed EGD to be thorough due to dyspepsia. Could also consider pancreatic enzymes as likely has chronic pancreatitis.    Will arrange colonoscopy at time of EGD.   PLAN    Proceed with colonoscopy/EGD by Dr. Abbey Chatters  in near future: the risks, benefits, and alternatives have been discussed with the patient in detail. The patient states understanding and desires to proceed.   PPI daily instead of prn  Follow-up thereafter  Consider pancreatic enzymes  ETOH and smoking cessation recommended  Follow-up of pulmonary nodule with  Pulmonary  Annitta Needs, PhD, ANP-BC [facility]

## 2021-05-10 NOTE — Telephone Encounter (Signed)
Tried to call pt to schedule TCS/EGD ASA 3 w/Dr. Abbey Chatters. Asked his sister to have him call office. ?

## 2021-05-10 NOTE — Telephone Encounter (Signed)
Jason Brooks from [facility] called and needed a new order for albuterol inhaler. Order placed. Nothing further needed.  ?

## 2021-05-10 NOTE — Telephone Encounter (Signed)
Returned the phone call of the pt and LMOVM ?

## 2021-05-11 NOTE — Telephone Encounter (Signed)
Phoned and LMOVM of the pt to return call. 

## 2021-05-13 NOTE — Telephone Encounter (Signed)
Letter mailed to the pt to contact the office if he still had questions ?

## 2021-05-31 NOTE — Patient Instructions (Signed)
? ? ? ? ? ? ? [place] ? 05/31/2021  ?  ? '@PREFPERIOPPHARMACY'$ @ ? ? Your procedure is scheduled on  06/06/2021. ? ? Report to [facility] at  1145  A.M. ? ? Call this number if you have problems the morning of surgery: ? [phone] ? ? Remember: ? Follow the diet and prep instructions given to you by the office. ? ?  Use your inhaler before you come and bring your rescue inhaler with you. ?  ? Take these medicines the morning of surgery with A SIP OF WATER  ? ?                              metoprolol, prilosec. ?  ? ? Do not wear jewelry, make-up or nail polish. ? Do not wear lotions, powders, or perfumes, or deodorant. ? Do not shave 48 hours prior to surgery.  Men may shave face and neck. ? Do not bring valuables to the hospital. ? [facility] is not responsible for any belongings or valuables. ? ?Contacts, dentures or bridgework may not be worn into surgery.  Leave your suitcase in the car.  After surgery it may be brought to your room. ? ?For patients admitted to the hospital, discharge time will be determined by your treatment team. ? ?Patients discharged the day of surgery will not be allowed to drive home and must have someone with them for 24 hours.  ? ? ?Special instructions:   DO NOT smoke tobacco or vape for 24 hours before your procedure. ? ?Please read over the following fact sheets that you were given. ?Anesthesia Post-op Instructions and Care and Recovery After Surgery ?  ? ? ? Upper Endoscopy, Adult, Care After ?This sheet gives you information about how to care for yourself after your procedure. Your health care provider may also give you more specific instructions. If you have problems or questions, contact your health care provider. ?What can I expect after the procedure? ?After the procedure, it is common to have: ?A sore throat. ?Mild stomach pain or discomfort. ?Bloating. ?Nausea. ?Follow these instructions at home: ? ?Follow instructions from your health care provider about what to eat  or drink after your procedure. ?Return to your normal activities as told by your health care provider. Ask your health care provider what activities are safe for you. ?Take over-the-counter and prescription medicines only as told by your health care provider. ?If you were given a sedative during the procedure, it can affect you for several hours. Do not drive or operate machinery until your health care provider says that it is safe. ?Keep all follow-up visits as told by your health care provider. This is important. ?Contact a health care provider if you have: ?A sore throat that lasts longer than one day. ?Trouble swallowing. ?Get help right away if: ?You vomit blood or your vomit looks like coffee grounds. ?You have: ?A fever. ?Bloody, black, or tarry stools. ?A severe sore throat or you cannot swallow. ?Difficulty breathing. ?Severe pain in your chest or abdomen. ?Summary ?After the procedure, it is common to have a sore throat, mild stomach discomfort, bloating, and nausea. ?If you were given a sedative during the procedure, it can affect you for several hours. Do not drive or operate machinery until your health care provider says that it is safe. ?Follow instructions from your health care provider about what to eat or drink after your procedure. ?  Return to your normal activities as told by your health care provider. ?This information is not intended to replace advice given to you by your health care provider. Make sure you discuss any questions you have with your health care provider. ?Document Revised: 11/08/2018 Document Reviewed: 06/04/2017 ?Elsevier Patient Education ? [place]. ?Colonoscopy, Adult, Care After ?The following information offers guidance on how to care for yourself after your procedure. Your health care provider may also give you more specific instructions. If you have problems or questions, contact your health care provider. ?What can I expect after the procedure? ?After the  procedure, it is common to have: ?A small amount of blood in your stool for 24 hours after the procedure. ?Some gas. ?Mild cramping or bloating of your abdomen. ?Follow these instructions at home: ?Eating and drinking ? ?Drink enough fluid to keep your urine pale yellow. ?Follow instructions from your health care provider about eating or drinking restrictions. ?Resume your normal diet as told by your health care provider. Avoid heavy or fried foods that are hard to digest. ?Activity ?Rest as told by your health care provider. ?Avoid sitting for a long time without moving. Get up to take short walks every 1-2 hours. This is important to improve blood flow and breathing. Ask for help if you feel weak or unsteady. ?Return to your normal activities as told by your health care provider. Ask your health care provider what activities are safe for you. ?Managing cramping and bloating ? ?Try walking around when you have cramps or feel bloated. ?If directed, apply heat to your abdomen as told by your health care provider. Use the heat source that your health care provider recommends, such as a moist heat pack or a heating pad. ?Place a towel between your skin and the heat source. ?Leave the heat on for 20-30 minutes. ?Remove the heat if your skin turns bright red. This is especially important if you are unable to feel pain, heat, or cold. You have a greater risk of getting burned. ?General instructions ?If you were given a sedative during the procedure, it can affect you for several hours. Do not drive or operate machinery until your health care provider says that it is safe. ?For the first 24 hours after the procedure: ?Do not sign important documents. ?Do not drink alcohol. ?Do your regular daily activities at a slower pace than normal. ?Eat soft foods that are easy to digest. ?Take over-the-counter and prescription medicines only as told by your health care provider. ?Keep all follow-up visits. This is important. ?Contact  a health care provider if: ?You have blood in your stool 2-3 days after the procedure. ?Get help right away if: ?You have more than a small spotting of blood in your stool. ?You have large blood clots in your stool. ?You have swelling of your abdomen. ?You have nausea or vomiting. ?You have a fever. ?You have increasing pain in your abdomen that is not relieved with medicine. ?These symptoms may be an emergency. Get help right away. Call 911. ?Do not wait to see if the symptoms will go away. ?Do not drive yourself to the hospital. ?Summary ?After the procedure, it is common to have a small amount of blood in your stool. You may also have mild cramping and bloating of your abdomen. ?If you were given a sedative during the procedure, it can affect you for several hours. Do not drive or operate machinery until your health care provider says that it is safe. ?Get  help right away if you have a lot of blood in your stool, nausea or vomiting, a fever, or increased pain in your abdomen. ?This information is not intended to replace advice given to you by your health care provider. Make sure you discuss any questions you have with your health care provider. ?Document Revised: 08/25/2020 Document Reviewed: 08/25/2020 ?Elsevier Patient Education ? [place]. ?Monitored Anesthesia Care, Care After ?This sheet gives you information about how to care for yourself after your procedure. Your health care provider may also give you more specific instructions. If you have problems or questions, contact your health care provider. ?What can I expect after the procedure? ?After the procedure, it is common to have: ?Tiredness. ?Forgetfulness about what happened after the procedure. ?Impaired judgment for important decisions. ?Nausea or vomiting. ?Some difficulty with balance. ?Follow these instructions at home: ?For the time period you were told by your health care provider: ? ?  ? ?Rest as needed. ?Do not participate in activities  where you could fall or become injured. ?Do not drive or use machinery. ?Do not drink alcohol. ?Do not take sleeping pills or medicines that cause drowsiness. ?Do not make important decisions or sign legal d

## 2021-06-01 ENCOUNTER — Telehealth: Payer: Self-pay | Admitting: Gastroenterology

## 2021-06-01 ENCOUNTER — Encounter: Payer: Self-pay | Admitting: Gastroenterology

## 2021-06-01 ENCOUNTER — Encounter: Payer: Self-pay | Admitting: Internal Medicine

## 2021-06-01 NOTE — Telephone Encounter (Signed)
Please arrange 3 month follow-up, history of pancreatitis. Thanks! ?

## 2021-06-02 ENCOUNTER — Encounter (HOSPITAL_COMMUNITY)
Admission: RE | Admit: 2021-06-02 | Discharge: 2021-06-02 | Disposition: A | Discharge status: Home / Self Care | Payer: Medicare Other | Source: Ambulatory Visit | Attending: Internal Medicine | Admitting: Internal Medicine

## 2021-06-02 ENCOUNTER — Encounter (HOSPITAL_COMMUNITY): Payer: Self-pay

## 2021-06-02 DIAGNOSIS — Z01818 Encounter for other preprocedural examination: Secondary | ICD-10-CM | POA: Insufficient documentation

## 2021-06-02 DIAGNOSIS — F1010 Alcohol abuse, uncomplicated: Secondary | ICD-10-CM | POA: Insufficient documentation

## 2021-06-02 DIAGNOSIS — F17210 Nicotine dependence, cigarettes, uncomplicated: Secondary | ICD-10-CM | POA: Insufficient documentation

## 2021-06-02 HISTORY — DX: Essential (primary) hypertension: I10

## 2021-06-02 HISTORY — DX: Aortic aneurysm of unspecified site, without rupture: I71.9

## 2021-06-02 HISTORY — DX: Solitary pulmonary nodule: R91.1

## 2021-06-02 HISTORY — DX: Unspecified asthma, uncomplicated: J45.909

## 2021-06-02 HISTORY — DX: Other specified diseases of liver: K76.89

## 2021-06-02 HISTORY — DX: Acute pancreatitis without necrosis or infection, unspecified: K85.90

## 2021-06-02 HISTORY — DX: Alcohol dependence, uncomplicated: F10.20

## 2021-06-02 LAB — COMPREHENSIVE METABOLIC PANEL
ALT: 24 U/L (ref 0–44)
AST: 35 U/L (ref 15–41)
Albumin: 3.9 g/dL (ref 3.5–5.0)
Alkaline Phosphatase: 80 U/L (ref 38–126)
Anion gap: 9 (ref 5–15)
BUN: 8 mg/dL (ref 8–23)
CO2: 19 mmol/L — ABNORMAL LOW (ref 22–32)
Calcium: 9.1 mg/dL (ref 8.9–10.3)
Chloride: 110 mmol/L (ref 98–111)
Creatinine, Ser: 0.72 mg/dL (ref 0.61–1.24)
GFR, Estimated: >60 mL/min (ref >60)
Glucose, Bld: 88 mg/dL (ref 70–99)
Potassium: 4.3 mmol/L (ref 3.5–5.1)
Sodium: 138 mmol/L (ref 135–145)
Total Bilirubin: 0.4 mg/dL (ref 0.3–1.2)
Total Protein: 7.1 g/dL (ref 6.5–8.1)

## 2021-06-06 ENCOUNTER — Ambulatory Visit (HOSPITAL_COMMUNITY)
Admission: RE | Admit: 2021-06-06 | Discharge: 2021-06-06 | Disposition: A | Discharge status: Home / Self Care | Payer: Medicare Other | Attending: Internal Medicine | Admitting: Internal Medicine

## 2021-06-06 ENCOUNTER — Ambulatory Visit (HOSPITAL_BASED_OUTPATIENT_CLINIC_OR_DEPARTMENT_OTHER): Payer: Medicare Other | Admitting: Anesthesiology

## 2021-06-06 ENCOUNTER — Encounter (HOSPITAL_COMMUNITY): Payer: Self-pay

## 2021-06-06 ENCOUNTER — Ambulatory Visit (HOSPITAL_COMMUNITY): Payer: Medicare Other | Admitting: Anesthesiology

## 2021-06-06 ENCOUNTER — Encounter (HOSPITAL_COMMUNITY)
Admission: RE | Disposition: A | Discharge status: Home / Self Care | Payer: Self-pay | Source: Home / Self Care | Attending: Internal Medicine

## 2021-06-06 ENCOUNTER — Telehealth: Payer: Self-pay

## 2021-06-06 DIAGNOSIS — K2990 Gastroduodenitis, unspecified, without bleeding: Secondary | ICD-10-CM | POA: Diagnosis not present

## 2021-06-06 DIAGNOSIS — R1013 Epigastric pain: Secondary | ICD-10-CM | POA: Diagnosis not present

## 2021-06-06 DIAGNOSIS — Z1211 Encounter for screening for malignant neoplasm of colon: Secondary | ICD-10-CM

## 2021-06-06 DIAGNOSIS — Z8719 Personal history of other diseases of the digestive system: Secondary | ICD-10-CM | POA: Insufficient documentation

## 2021-06-06 DIAGNOSIS — K2970 Gastritis, unspecified, without bleeding: Secondary | ICD-10-CM | POA: Insufficient documentation

## 2021-06-06 DIAGNOSIS — D124 Benign neoplasm of descending colon: Secondary | ICD-10-CM | POA: Insufficient documentation

## 2021-06-06 DIAGNOSIS — K2980 Duodenitis without bleeding: Secondary | ICD-10-CM | POA: Insufficient documentation

## 2021-06-06 DIAGNOSIS — I10 Essential (primary) hypertension: Secondary | ICD-10-CM | POA: Diagnosis not present

## 2021-06-06 DIAGNOSIS — Z791 Long term (current) use of non-steroidal anti-inflammatories (NSAID): Secondary | ICD-10-CM | POA: Insufficient documentation

## 2021-06-06 DIAGNOSIS — K30 Functional dyspepsia: Secondary | ICD-10-CM

## 2021-06-06 DIAGNOSIS — F17210 Nicotine dependence, cigarettes, uncomplicated: Secondary | ICD-10-CM | POA: Insufficient documentation

## 2021-06-06 DIAGNOSIS — K219 Gastro-esophageal reflux disease without esophagitis: Secondary | ICD-10-CM | POA: Insufficient documentation

## 2021-06-06 DIAGNOSIS — K635 Polyp of colon: Secondary | ICD-10-CM

## 2021-06-06 DIAGNOSIS — R911 Solitary pulmonary nodule: Secondary | ICD-10-CM

## 2021-06-06 DIAGNOSIS — R011 Cardiac murmur, unspecified: Secondary | ICD-10-CM | POA: Diagnosis not present

## 2021-06-06 DIAGNOSIS — Z79899 Other long term (current) drug therapy: Secondary | ICD-10-CM | POA: Diagnosis not present

## 2021-06-06 DIAGNOSIS — J449 Chronic obstructive pulmonary disease, unspecified: Secondary | ICD-10-CM | POA: Insufficient documentation

## 2021-06-06 HISTORY — PX: COLONOSCOPY WITH PROPOFOL: SHX5780

## 2021-06-06 HISTORY — PX: BIOPSY: SHX5522

## 2021-06-06 HISTORY — PX: POLYPECTOMY: SHX149

## 2021-06-06 HISTORY — PX: ESOPHAGOGASTRODUODENOSCOPY (EGD) WITH PROPOFOL: SHX5813

## 2021-06-06 SURGERY — COLONOSCOPY WITH PROPOFOL
Anesthesia: General

## 2021-06-06 MED ORDER — LACTATED RINGERS IV SOLN: INTRAVENOUS | Status: DC

## 2021-06-06 MED ORDER — PHENYLEPHRINE HCL (PRESSORS) 10 MG/ML IV SOLN
INTRAVENOUS | Status: DC | PRN
  Administered 2021-06-06 (×2): 80 ug via INTRAVENOUS

## 2021-06-06 MED ORDER — PROPOFOL 10 MG/ML IV BOLUS
INTRAVENOUS | Status: DC | PRN
Start: 2021-06-06 — End: 2021-06-06
  Administered 2021-06-06: 50 mg via INTRAVENOUS

## 2021-06-06 MED ORDER — PROPOFOL 500 MG/50ML IV EMUL
INTRAVENOUS | Status: DC | PRN
  Administered 2021-06-06: 125 ug/kg/min via INTRAVENOUS

## 2021-06-06 MED ORDER — LIDOCAINE HCL 1 % IJ SOLN
INTRAMUSCULAR | Status: DC | PRN
  Administered 2021-06-06: 80 mg via INTRADERMAL

## 2021-06-06 MED ORDER — OMEPRAZOLE 20 MG PO CPDR: 20.0000 mg | DELAYED_RELEASE_CAPSULE | Freq: Two times a day (BID) | ORAL | 5 refills | Status: AC

## 2021-06-06 NOTE — Anesthesia Postprocedure Evaluation (Signed)
Anesthesia Post Note  Patient: Jason Brooks.  Procedure(s) Performed: COLONOSCOPY WITH PROPOFOL ESOPHAGOGASTRODUODENOSCOPY (EGD) WITH PROPOFOL BIOPSY POLYPECTOMY INTESTINAL  Patient location during evaluation: Phase II Anesthesia Type: General Level of consciousness: awake and alert and oriented Pain management: pain level controlled Vital Signs Assessment: post-procedure vital signs reviewed and stable Respiratory status: spontaneous breathing, nonlabored ventilation and respiratory function stable Cardiovascular status: blood pressure returned to baseline and stable Postop Assessment: no apparent nausea or vomiting Anesthetic complications: no   No notable events documented.   Last Vitals:  Vitals:   06/06/21 1250 06/06/21 1254  BP: (!) 84/60 102/65  Pulse: (!) 59   Resp: (!) 22   Temp:    SpO2: 100%     Last Pain:  Vitals:   06/06/21 1250  TempSrc:   PainSc: 0-No pain                 Leydi Winstead C Brenna Friesenhahn

## 2021-06-06 NOTE — Discharge Instructions (Addendum)
EGD Discharge instructions Please read the instructions outlined below and refer to this sheet in the next few weeks. These discharge instructions provide you with general information on caring for yourself after you leave the hospital. Your doctor may also give you specific instructions. While your treatment has been planned according to the most current medical practices available, unavoidable complications occasionally occur. If you have any problems or questions after discharge, please call your doctor. ACTIVITY You may resume your regular activity but move at a slower pace for the next 24 hours.  Take frequent rest periods for the next 24 hours.  Walking will help expel (get rid of) the air and reduce the bloated feeling in your abdomen.  No driving for 24 hours (because of the anesthesia (medicine) used during the test).  You may shower.  Do not sign any important legal documents or operate any machinery for 24 hours (because of the anesthesia used during the test).  NUTRITION Drink plenty of fluids.  You may resume your normal diet.  Begin with a light meal and progress to your normal diet.  Avoid alcoholic beverages for 24 hours or as instructed by your caregiver.  MEDICATIONS You may resume your normal medications unless your caregiver tells you otherwise.  WHAT YOU CAN EXPECT TODAY You may experience abdominal discomfort such as a feeling of fullness or "gas" pains.  FOLLOW-UP Your doctor will discuss the results of your test with you.  SEEK IMMEDIATE MEDICAL ATTENTION IF ANY OF THE FOLLOWING OCCUR: Excessive nausea (feeling sick to your stomach) and/or vomiting.  Severe abdominal pain and distention (swelling).  Trouble swallowing.  Temperature over 101 F (37.8 C).  Rectal bleeding or vomiting of blood.    Colonoscopy Discharge Instructions  Read the instructions outlined below and refer to this sheet in the next few weeks. These discharge instructions provide you with  general information on caring for yourself after you leave the hospital. Your doctor may also give you specific instructions. While your treatment has been planned according to the most current medical practices available, unavoidable complications occasionally occur.   ACTIVITY You may resume your regular activity, but move at a slower pace for the next 24 hours.  Take frequent rest periods for the next 24 hours.  Walking will help get rid of the air and reduce the bloated feeling in your belly (abdomen).  No driving for 24 hours (because of the medicine (anesthesia) used during the test).   Do not sign any important legal documents or operate any machinery for 24 hours (because of the anesthesia used during the test).  NUTRITION Drink plenty of fluids.  You may resume your normal diet as instructed by your doctor.  Begin with a light meal and progress to your normal diet. Heavy or fried foods are harder to digest and may make you feel sick to your stomach (nauseated).  Avoid alcoholic beverages for 24 hours or as instructed.  MEDICATIONS You may resume your normal medications unless your doctor tells you otherwise.  WHAT YOU CAN EXPECT TODAY Some feelings of bloating in the abdomen.  Passage of more gas than usual.  Spotting of blood in your stool or on the toilet paper.  IF YOU HAD POLYPS REMOVED DURING THE COLONOSCOPY: No aspirin products for 7 days or as instructed.  No alcohol for 7 days or as instructed.  Eat a soft diet for the next 24 hours.  FINDING OUT THE RESULTS OF YOUR TEST Not all test results are available  during your visit. If your test results are not back during the visit, make an appointment with your caregiver to find out the results. Do not assume everything is normal if you have not heard from your caregiver or the medical facility. It is important for you to follow up on all of your test results.  SEEK IMMEDIATE MEDICAL ATTENTION IF: You have more than a spotting of  blood in your stool.  Your belly is swollen (abdominal distention).  You are nauseated or vomiting.  You have a temperature over 101.  You have abdominal pain or discomfort that is severe or gets worse throughout the day.   Your EGD revealed mild amount inflammation in your stomach.  I took biopsies of this to rule out infection with a bacteria called H. pylori.  Await pathology results, my office will contact you. I am going to increase your Omeprazole to twice daily and have sent this to your pharmacy.   Your colonoscopy revealed 1 polyp(s) which I removed successfully. Await pathology results, my office will contact you. I recommend repeating colonoscopy in 5 years for surveillance purposes.   Follow up with GI in 3-4 months.    I hope you have a great rest of your week!  Elon Alas. Abbey Chatters, D.O. Gastroenterology and Hepatology [facility]

## 2021-06-06 NOTE — OR Nursing (Signed)
Patient would like procedure results sent to Dr. Yong Channel at [facility].

## 2021-06-06 NOTE — Op Note (Signed)
[facility] Patient Name: Jason Brooks Procedure Date: 06/06/2021 12:15 PM MRN: [id number] Date of Birth: [birth year] Attending MD: Elon Alas. Edgar Frisk CSN: [id number] Age: 66 Admit Type: Outpatient Procedure:                Upper GI endoscopy Indications:              Epigastric abdominal pain, Functional Dyspepsia Providers:                Elon Alas. Abbey Chatters, DO, Janeece Riggers, RN, Caprice Kluver,                            Crystal Page, Raphael Gibney, Technician Referring MD:              Medicines:                See the Anesthesia note for documentation of the                            administered medications Complications:            No immediate complications. Estimated Blood Loss:     Estimated blood loss was minimal. Procedure:                Pre-Anesthesia Assessment:                           - The anesthesia plan was to use monitored                            anesthesia care (MAC).                           After obtaining informed consent, the endoscope was                            passed under direct vision. Throughout the                            procedure, the patient's blood pressure, pulse, and                            oxygen saturations were monitored continuously. The                            [id number]-H190 ([id number]) scope was introduced through the                            mouth, and advanced to the second part of duodenum.                            The upper GI endoscopy was accomplished without                            difficulty. The patient tolerated the procedure  well. Scope In: 12:26:07 PM Scope Out: 12:28:13 PM Total Procedure Duration: 0 hours 2 minutes 6 seconds  Findings:      The Z-line was regular and was found 38 cm from the incisors.      Diffuse moderate inflammation characterized by erosions and erythema was       found in the entire examined stomach. Biopsies were taken with a cold       forceps for  Helicobacter pylori testing.      Localized mild inflammation characterized by erythema was found in the       duodenal bulb and in the first portion of the duodenum. Impression:               - Z-line regular, 38 cm from the incisors.                           - Gastritis. Biopsied.                           - Duodenitis. Moderate Sedation:      Per Anesthesia Care Recommendation:           - Patient has a contact number available for                            emergencies. The signs and symptoms of potential                            delayed complications were discussed with the                            patient. Return to normal activities tomorrow.                            Written discharge instructions were provided to the                            patient.                           - Resume previous diet.                           - Continue present medications.                           - Await pathology results.                           - Use Prilosec (omeprazole) 20 mg PO BID.                           - No ibuprofen, naproxen, or other non-steroidal                            anti-inflammatory drugs.                           - Likely alcohol induced gastritis,  recommend                            cessation. Procedure Code(s):        --- Professional ---                           [phone], Esophagogastroduodenoscopy, flexible,                            transoral; with biopsy, single or multiple Diagnosis Code(s):        --- Professional ---                           K29.70, Gastritis, unspecified, without bleeding                           K29.80, Duodenitis without bleeding                           R10.13, Epigastric pain                           K30, Functional dyspepsia CPT copyright 2019 American Medical Association. All rights reserved. The codes documented in this report are preliminary and upon coder review may  be revised to meet current compliance  requirements. Elon Alas. Abbey Chatters, DO [place] Abbey Chatters, DO 06/06/2021 12:31:27 PM This report has been signed electronically. Number of Addenda: 0

## 2021-06-06 NOTE — Interval H&P Note (Signed)
History and Physical Interval Note:  06/06/2021 12:13 PM  Jason Brooks.  has presented today for surgery, with the diagnosis of screening colonoscopy, dyspepsia.  The various methods of treatment have been discussed with the patient and family. After consideration of risks, benefits and other options for treatment, the patient has consented to  Procedure(s) with comments: COLONOSCOPY WITH PROPOFOL (N/A) - 1:45pm ESOPHAGOGASTRODUODENOSCOPY (EGD) WITH PROPOFOL (N/A) as a surgical intervention.  The patient's history has been reviewed, patient examined, no change in status, stable for surgery.  I have reviewed the patient's chart and labs.  Questions were answered to the patient's satisfaction.     [name]

## 2021-06-06 NOTE — Telephone Encounter (Signed)
PA for Omeprazole 20 mg was approved from 01/16/21 through 01/15/22. Approval letter will be scanned in to patient's chart.

## 2021-06-06 NOTE — Anesthesia Preprocedure Evaluation (Signed)
Anesthesia Evaluation  Patient identified by MRN, date of birth, ID band Patient awake    Reviewed: Allergy & Precautions, NPO status , Patient's Chart, lab work & pertinent test results, reviewed documented beta blocker date and time   Airway Mallampati: II  TM Distance: >3 FB Neck ROM: Full    Dental  (+) Edentulous Upper, Edentulous Lower   Pulmonary shortness of breath and with exertion, asthma , COPD,  COPD inhaler, Current Smoker and Patient abstained from smoking.,           Cardiovascular Exercise Tolerance: Good hypertension, Pt. on medications and Pt. on home beta blockers  Rhythm:Regular Rate:Normal + Systolic murmurs [id number] 11:47:04 [facility]-AP-OPS ROUTINE RECORD 07/31/1955 (44 yr) Male Caucasian Vent. rate 50 BPM PR interval 154 ms QRS duration 98 ms QT/QTcB 438/399 ms P-R-T axes 50 46 -73 Sinus bradycardia Minimal voltage criteria for LVH, may be normal variant ( Sokolow-Lyon ) ST & Marked T-wave abnormality, consider inferolateral ischemia Abnormal ECG No previous ECGs available No previous tracing Confirmed by Lyman Bishop [phone]) on 06/03/2021 8:12:10 AM   Neuro/Psych negative neurological ROS  negative psych ROS   GI/Hepatic GERD  Medicated,(+)     substance abuse (6-8 drinks per day)  alcohol use,   Endo/Other  negative endocrine ROS  Renal/GU negative Renal ROS  negative genitourinary   Musculoskeletal negative musculoskeletal ROS (+)   Abdominal   Peds negative pediatric ROS (+)  Hematology negative hematology ROS (+)   Anesthesia Other Findings   Reproductive/Obstetrics negative OB ROS                             Anesthesia Physical Anesthesia Plan  ASA: 3  Anesthesia Plan: General   Post-op Pain Management: Minimal or no pain anticipated   Induction: Intravenous  PONV Risk Score and Plan: Propofol infusion  Airway  Management Planned: Nasal Cannula and Natural Airway  Additional Equipment:   Intra-op Plan:   Post-operative Plan:   Informed Consent: I have reviewed the patients History and Physical, chart, labs and discussed the procedure including the risks, benefits and alternatives for the proposed anesthesia with the patient or authorized representative who has indicated his/her understanding and acceptance.       Plan Discussed with: CRNA and Surgeon  Anesthesia Plan Comments:         Anesthesia Quick Evaluation

## 2021-06-06 NOTE — Op Note (Signed)
[facility] Patient Name: Jason Brooks Procedure Date: 06/06/2021 12:17 PM MRN: [id number] Date of Birth: Feb 04, 1955 Attending MD: Elon Alas. Abbey Chatters DO CSN: [id number] Age: 66 Admit Type: Outpatient Procedure:                Colonoscopy Indications:              Screening for colorectal malignant neoplasm Providers:                Elon Alas. Abbey Chatters, DO, Janeece Riggers, RN, Crystal                            Page, Caprice Kluver Referring MD:              Medicines:                See the Anesthesia note for documentation of the                            administered medications Complications:            No immediate complications. Estimated Blood Loss:     Estimated blood loss was minimal. Procedure:                Pre-Anesthesia Assessment:                           - The anesthesia plan was to use monitored                            anesthesia care (MAC).                           After obtaining informed consent, the colonoscope                            was passed under direct vision. Throughout the                            procedure, the patient's blood pressure, pulse, and                            oxygen saturations were monitored continuously. The                            [id number]-[id number] ([id number]) scope was introduced through                            the anus and advanced to the the cecum, identified                            by appendiceal orifice and ileocecal valve. The                            colonoscopy was performed without difficulty. The                            patient tolerated the procedure well.  The quality                            of the bowel preparation was evaluated using the                            BBPS [facility] Bowel Preparation Scale) with scores                            of: Right Colon = 3, Transverse Colon = 3 and Left                            Colon = 3 (entire mucosa seen well with no residual                            staining, small  fragments of stool or opaque                            liquid). The total BBPS score equals 9. Scope In: 12:32:39 PM Scope Out: 81:44:81 PM Scope Withdrawal Time: 0 hours 7 minutes 4 seconds  Total Procedure Duration: 0 hours 14 minutes 2 seconds  Findings:      The perianal and digital rectal examinations were normal.      A 5 mm polyp was found in the descending colon. The polyp was sessile.       The polyp was removed with a cold snare. Resection and retrieval were       complete.      The exam was otherwise without abnormality. Impression:               - One 5 mm polyp in the descending colon, removed                            with a cold snare. Resected and retrieved.                           - The examination was otherwise normal. Moderate Sedation:      Per Anesthesia Care Recommendation:           - Patient has a contact number available for                            emergencies. The signs and symptoms of potential                            delayed complications were discussed with the                            patient. Return to normal activities tomorrow.                            Written discharge instructions were provided to the                            patient.                           -  Resume previous diet.                           - Continue present medications.                           - Await pathology results.                           - Repeat colonoscopy in 5 years for surveillance.                           - Return to GI clinic in 4 months. Procedure Code(s):        --- Professional ---                           [phone], Colonoscopy, flexible; with removal of                            tumor(s), polyp(s), or other lesion(s) by snare                            technique Diagnosis Code(s):        --- Professional ---                           Z12.11, Encounter for screening for malignant                            neoplasm of colon                            K63.5, Polyp of colon CPT copyright 2019 American Medical Association. All rights reserved. The codes documented in this report are preliminary and upon coder review may  be revised to meet current compliance requirements. Elon Alas. Abbey Chatters, DO [place] Abbey Chatters, DO 06/06/2021 12:48:14 PM This report has been signed electronically. Number of Addenda: 0

## 2021-06-06 NOTE — Transfer of Care (Signed)
Immediate Anesthesia Transfer of Care Note  Patient: Jason Brooks.  Procedure(s) Performed: COLONOSCOPY WITH PROPOFOL ESOPHAGOGASTRODUODENOSCOPY (EGD) WITH PROPOFOL BIOPSY POLYPECTOMY INTESTINAL  Patient Location: Short Stay  Anesthesia Type:General  Level of Consciousness: awake  Airway & Oxygen Therapy: Patient Spontanous Breathing  Post-op Assessment: Report given to RN and Post -op Vital signs reviewed and stable  Post vital signs: Reviewed and stable  Last Vitals:  Vitals Value Taken Time  BP 84/60 06/06/21 1250  Temp    Pulse 59 06/06/21 1250  Resp 22 06/06/21 1250  SpO2 100 % 06/06/21 1250    Last Pain:  Vitals:   06/06/21 1250  TempSrc:   PainSc: 0-No pain         Complications: No notable events documented.

## 2021-06-07 LAB — SURGICAL PATHOLOGY

## 2021-06-09 ENCOUNTER — Encounter (HOSPITAL_COMMUNITY): Payer: Self-pay | Admitting: Internal Medicine

## 2021-08-02 ENCOUNTER — Other Ambulatory Visit: Payer: Self-pay

## 2021-08-02 DIAGNOSIS — J449 Chronic obstructive pulmonary disease, unspecified: Secondary | ICD-10-CM

## 2021-08-02 NOTE — Progress Notes (Signed)
After reviewing chart for pts appt tomorrow- noted that pft order was not placed during last ov. Order palced.

## 2021-08-03 ENCOUNTER — Ambulatory Visit: Payer: Medicare Other | Admitting: Internal Medicine

## 2021-08-03 NOTE — Progress Notes (Deleted)
Jason Brooks., male    DOB: [birth year],    MRN: [id number]   Brief patient profile:  10  yowm from [place]  active smoker  referred to pulmonary clinic in [place]  05/02/2021 by Suzzanne Cloud NP   for spn in pt  = 8 mm GG RUL at [facility] in [place] CT2/19/23      History of Present Illness  05/02/2021  Pulmonary/ 1st office eval/ Jason Brooks / [place] [facility]  Chief Complaint  Patient presents with   Consult    Appt for lung nodule noted on ct scan no sob or trouble breathing. Has rescue inhaler not prescribed to patient that he uses once a day  Dyspnea:  walks to grocery store x 4 miles and back includes hills Cough: none  Sleep: flat bed on side/ one pillow  SABA use: 1st thing in am daily then nothing else the rest of the day  Rec Only use your albuterol as a rescue medication  Ok to try albuterol 15 min before an activity (on alternating days)  that you know would usually make you short of breath  The key is to stop smoking completely before smoking completely stops you! PFT's on return Please schedule a follow up visit in 3 months but call sooner if needed  with all medications /inhalers/ solutions in hand    08/03/2021  f/u ov/[place] office/Jason Brooks re: *** maint on ***  No chief complaint on file.   Dyspnea:  *** Cough: *** Sleeping: *** SABA use: *** 02: *** Covid status: *** Lung cancer screening: ***   No obvious day to day or daytime variability or assoc excess/ purulent sputum or mucus plugs or hemoptysis or cp or chest tightness, subjective wheeze or overt sinus or hb symptoms.   *** without nocturnal  or early am exacerbation  of respiratory  c/o's or need for noct saba. Also denies any obvious fluctuation of symptoms with weather or environmental changes or other aggravating or alleviating factors except as outlined above   No unusual exposure hx or h/o childhood pna/ asthma or knowledge of premature birth.  Current Allergies, Complete Past Medical History,  Past Surgical History, Family History, and Social History were reviewed in [employer] record.  ROS  The following are not active complaints unless bolded Hoarseness, sore throat, dysphagia, dental problems, itching, sneezing,  nasal congestion or discharge of excess mucus or purulent secretions, ear ache,   fever, chills, sweats, unintended wt loss or wt gain, classically pleuritic or exertional cp,  orthopnea pnd or arm/hand swelling  or leg swelling, presyncope, palpitations, abdominal pain, anorexia, nausea, vomiting, diarrhea  or change in bowel habits or change in bladder habits, change in stools or change in urine, dysuria, hematuria,  rash, arthralgias, visual complaints, headache, numbness, weakness or ataxia or problems with walking or coordination,  change in mood or  memory.        No outpatient medications have been marked as taking for the 08/03/21 encounter (Appointment) with Tanda Rockers, MD.                      Objective:     Wt Readings from Last 3 Encounters:  06/06/21 143 lb (64.9 kg)  05/10/21 141 lb (64 kg)  05/02/21 141 lb (64 kg)      Vital signs reviewed  08/03/2021  - Note at rest 02 sats  ***% on ***   General appearance:    ***  Min bar ***          Assessment

## 2021-09-06 ENCOUNTER — Ambulatory Visit: Payer: Medicare Other | Admitting: Gastroenterology

## 2021-09-06 NOTE — Progress Notes (Deleted)
Colonoscopy May 2023: one 5 mm polyp in descending colon. Tubular adenoma  EGD May 2023: gastritis, duodenitis. Negative H.pylori.     history of pancreatitis, reporting 3-4 episodes since 2011 and most recently Feb 2023.    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 Although triglycerides elevated (364) in sept 2022, most likely ETOH-induced. Encouragingly, MRI without any occult mass or lesions of pancreas.

## 2021-09-14 ENCOUNTER — Ambulatory Visit: Payer: Medicare Other | Admitting: Gastroenterology

## 2021-09-16 HISTORY — PX: CARDIAC CATHETERIZATION: SHX172

## 2021-09-22 HISTORY — PX: CARDIAC VALVE REPLACEMENT: SHX585

## 2022-03-13 ENCOUNTER — Encounter: Payer: Self-pay | Admitting: Ophthalmology

## 2022-03-16 NOTE — Discharge Instructions (Signed)

## 2022-03-20 ENCOUNTER — Other Ambulatory Visit: Payer: Self-pay

## 2022-03-20 ENCOUNTER — Ambulatory Visit
Admission: RE | Admit: 2022-03-20 | Discharge: 2022-03-20 | Disposition: A | Discharge status: Home / Self Care | Payer: 59 | Attending: Ophthalmology | Admitting: Ophthalmology

## 2022-03-20 ENCOUNTER — Encounter: Payer: Self-pay | Admitting: Ophthalmology

## 2022-03-20 ENCOUNTER — Encounter
Admission: RE | Disposition: A | Discharge status: Home / Self Care | Payer: Self-pay | Source: Home / Self Care | Attending: Ophthalmology

## 2022-03-20 ENCOUNTER — Ambulatory Visit: Payer: 59 | Admitting: Anesthesiology

## 2022-03-20 DIAGNOSIS — K219 Gastro-esophageal reflux disease without esophagitis: Secondary | ICD-10-CM | POA: Diagnosis not present

## 2022-03-20 DIAGNOSIS — I110 Hypertensive heart disease with heart failure: Secondary | ICD-10-CM | POA: Diagnosis not present

## 2022-03-20 DIAGNOSIS — I509 Heart failure, unspecified: Secondary | ICD-10-CM | POA: Insufficient documentation

## 2022-03-20 DIAGNOSIS — J449 Chronic obstructive pulmonary disease, unspecified: Secondary | ICD-10-CM | POA: Diagnosis not present

## 2022-03-20 DIAGNOSIS — F17210 Nicotine dependence, cigarettes, uncomplicated: Secondary | ICD-10-CM | POA: Diagnosis not present

## 2022-03-20 DIAGNOSIS — H2511 Age-related nuclear cataract, right eye: Secondary | ICD-10-CM | POA: Diagnosis present

## 2022-03-20 HISTORY — PX: CATARACT EXTRACTION W/PHACO: SHX586

## 2022-03-20 HISTORY — DX: Dizziness and giddiness: R42

## 2022-03-20 HISTORY — DX: Heart failure, unspecified: I50.9

## 2022-03-20 HISTORY — DX: Presence of dental prosthetic device (complete) (partial): Z97.2

## 2022-03-20 LAB — GLUCOSE, CAPILLARY: Glucose-Capillary: 107 mg/dL — ABNORMAL HIGH (ref 70–99)

## 2022-03-20 SURGERY — PHACOEMULSIFICATION, CATARACT, WITH IOL INSERTION
Anesthesia: General | Site: Eye | Laterality: Right

## 2022-03-20 MED ORDER — TETRACAINE HCL 0.5 % OP SOLN
1.0000 [drp] | OPHTHALMIC | Status: DC | PRN
  Administered 2022-03-20 (×3): 1 [drp] via OPHTHALMIC

## 2022-03-20 MED ORDER — MOXIFLOXACIN HCL 0.5 % OP SOLN
OPHTHALMIC | Status: DC | PRN
  Administered 2022-03-20: .2 mL via OPHTHALMIC

## 2022-03-20 MED ORDER — MIDAZOLAM HCL 2 MG/2ML IJ SOLN
INTRAMUSCULAR | Status: DC | PRN
  Administered 2022-03-20: 2 mg via INTRAVENOUS

## 2022-03-20 MED ORDER — ARMC OPHTHALMIC DILATING DROPS
1.0000 | OPHTHALMIC | Status: DC | PRN
  Administered 2022-03-20 (×3): 1 via OPHTHALMIC

## 2022-03-20 MED ORDER — SIGHTPATH DOSE#1 BSS IO SOLN
INTRAOCULAR | Status: DC | PRN
  Administered 2022-03-20: 15 mL

## 2022-03-20 MED ORDER — SIGHTPATH DOSE#1 NA HYALUR & NA CHOND-NA HYALUR IO KIT
PACK | INTRAOCULAR | Status: DC | PRN
  Administered 2022-03-20: 1 via OPHTHALMIC

## 2022-03-20 MED ORDER — FENTANYL CITRATE (PF) 100 MCG/2ML IJ SOLN
INTRAMUSCULAR | Status: DC | PRN
  Administered 2022-03-20 (×2): 50 ug via INTRAVENOUS

## 2022-03-20 MED ORDER — LACTATED RINGERS IV SOLN: INTRAVENOUS | Status: DC

## 2022-03-20 MED ORDER — LIDOCAINE HCL (PF) 2 % IJ SOLN
INTRAOCULAR | Status: DC | PRN
  Administered 2022-03-20: 1 mL via INTRAOCULAR

## 2022-03-20 MED ORDER — BRIMONIDINE TARTRATE-TIMOLOL 0.2-0.5 % OP SOLN
OPHTHALMIC | Status: DC | PRN
  Administered 2022-03-20: 1 [drp] via OPHTHALMIC

## 2022-03-20 MED ORDER — PHENYLEPHRINE-KETOROLAC 1-0.3 % IO SOLN
INTRAOCULAR | Status: DC | PRN
  Administered 2022-03-20: 70 mL via OPHTHALMIC

## 2022-03-20 SURGICAL SUPPLY — 13 items
CATARACT SUITE SIGHTPATH (MISCELLANEOUS) ×1 IMPLANT
DISSECTOR HYDRO NUCLEUS 50X22 (MISCELLANEOUS) ×1 IMPLANT
DRSG TEGADERM 2-3/8X2-3/4 SM (GAUZE/BANDAGES/DRESSINGS) ×1 IMPLANT
FEE CATARACT SUITE SIGHTPATH (MISCELLANEOUS) ×1 IMPLANT
GLOVE SURG SYN 7.5  E (GLOVE) ×1
GLOVE SURG SYN 7.5 E (GLOVE) ×1 IMPLANT
GLOVE SURG SYN 7.5 PF PI (GLOVE) ×1 IMPLANT
GLOVE SURG SYN 8.5  E (GLOVE) ×1
GLOVE SURG SYN 8.5 E (GLOVE) ×1 IMPLANT
GLOVE SURG SYN 8.5 PF PI (GLOVE) ×1 IMPLANT
LENS IOL TECNIS EYHANCE 17.5 (Intraocular Lens) IMPLANT
RING MALYGIN (MISCELLANEOUS) IMPLANT
WATER STERILE IRR 250ML POUR (IV SOLUTION) ×1 IMPLANT

## 2022-03-20 NOTE — Anesthesia Postprocedure Evaluation (Signed)
Anesthesia Post Note  Patient: Jason Brooks.  Procedure(s) Performed: CATARACT EXTRACTION PHACO AND INTRAOCULAR LENS PLACEMENT (IOC) RIGHT OMIDRIA  7.14  00:41.4 (Right: Eye)  Patient location during evaluation: PACU Anesthesia Type: MAC Level of consciousness: awake and alert Pain management: pain level controlled Vital Signs Assessment: post-procedure vital signs reviewed and stable Respiratory status: spontaneous breathing, nonlabored ventilation, respiratory function stable and patient connected to nasal cannula oxygen Cardiovascular status: stable and blood pressure returned to baseline Postop Assessment: no apparent nausea or vomiting Anesthetic complications: no  No notable events documented.   Last Vitals:  Vitals:   03/20/22 0956 03/20/22 1000  BP: 109/64 (!) 132/90  Pulse: (!) 53 63  Resp: (!) 9 16  Temp: (!) 36.1 C (!) 36.1 C  SpO2: 100% 99%    Last Pain:  Vitals:   03/20/22 1000  PainSc: 0-No pain                 Dimas Millin

## 2022-03-20 NOTE — Anesthesia Preprocedure Evaluation (Addendum)
Anesthesia Evaluation  Patient identified by MRN, date of birth, ID band Patient awake    Reviewed: Allergy & Precautions, NPO status , Patient's Chart, lab work & pertinent test results, reviewed documented beta blocker date and time   Airway Mallampati: II  TM Distance: >3 FB Neck ROM: Full    Dental  (+) Edentulous Upper, Edentulous Lower   Pulmonary neg pulmonary ROS, shortness of breath and with exertion, asthma , COPD,  COPD inhaler, Current Smoker and Patient abstained from smoking.   Pulmonary exam normal        Cardiovascular Exercise Tolerance: Good hypertension, Pt. on medications and Pt. on home beta blockers negative cardio ROS Normal cardiovascular exam Rhythm:Regular Rate:Normal + Systolic murmurs [id number] 11:47:04 [facility] ROUTINE RECORD 1955-03-09 (72 yr) Male Caucasian Vent. rate 50 BPM PR interval 154 ms QRS duration 98 ms QT/QTcB 438/399 ms P-R-T axes 50 46 -73 Sinus bradycardia Minimal voltage criteria for LVH, may be normal variant ( Sokolow-Lyon ) ST & Marked T-wave abnormality, consider inferolateral ischemia Abnormal ECG No previous ECGs available No previous tracing Confirmed by Lyman Bishop [phone]) on 06/03/2021 8:12:10 AM   Neuro/Psych negative neurological ROS  negative psych ROS   GI/Hepatic negative GI ROS, Neg liver ROS,GERD  Medicated,,(+)     substance abuse (6-8 drinks per day)  alcohol use  Endo/Other  negative endocrine ROS    Renal/GU negative Renal ROS  negative genitourinary   Musculoskeletal negative musculoskeletal ROS (+)    Abdominal   Peds negative pediatric ROS (+)  Hematology negative hematology ROS (+)   Anesthesia Other Findings Past Medical History: No date: Alcoholism ([place]) No date: Aortic aneurysm (HCC) No date: Asthma No date: CHF (congestive heart failure) (HCC) No date: GERD (gastroesophageal reflux disease) No date:  Hypertension No date: Liver cyst No date: Lung nodule No date: Pancreatitis No date: Vertigo No date: Wears dentures     Comment:  full upper and lower.  doesn't wear  Past Surgical History: 06/06/2021: BIOPSY     Comment:  Procedure: BIOPSY;  Surgeon: [name], DO;                [facility];  Service: Endoscopy;; 09/16/2021: CARDIAC CATHETERIZATION     Comment:  [facility] 09/22/2021: CARDIAC VALVE REPLACEMENT 06/06/2021: COLONOSCOPY WITH PROPOFOL; N/A     Comment:  Procedure: COLONOSCOPY WITH PROPOFOL;  Surgeon: [name], DO;  [facility];  Service:               Endoscopy;  Laterality: N/A;  1:45pm 06/06/2021: ESOPHAGOGASTRODUODENOSCOPY (EGD) WITH PROPOFOL; N/A     Comment:  Procedure: ESOPHAGOGASTRODUODENOSCOPY (EGD) WITH               PROPOFOL;  Surgeon: [name], DO;  Location: [facility];  Service: Endoscopy;  Laterality: N/A; No date: FOOT FRACTURE SURGERY; Left 06/06/2021: POLYPECTOMY     Comment:  Procedure: POLYPECTOMY INTESTINAL;  Surgeon: [name], DO;  [facility];  Service:               Endoscopy;; No date: SHOULDER SURGERY; Left  BMI    Body Mass Index: 23.26 kg/m      Reproductive/Obstetrics negative  OB ROS                             Anesthesia Physical Anesthesia Plan  ASA: 3  Anesthesia Plan: General   Post-op Pain Management: Minimal or no pain anticipated   Induction: Intravenous  PONV Risk Score and Plan: Propofol infusion  Airway Management Planned: Natural Airway and Nasal Cannula  Additional Equipment:   Intra-op Plan:   Post-operative Plan:   Informed Consent: I have reviewed the patients History and Physical, chart, labs and discussed the procedure including the risks, benefits and alternatives for the proposed anesthesia with the patient or authorized representative who has indicated his/her  understanding and acceptance.       Plan Discussed with: CRNA and Surgeon  Anesthesia Plan Comments: (Patient consented for risks of anesthesia including but not limited to:  - adverse reactions to medications - damage to eyes, teeth, lips or other oral mucosa - nerve damage due to positioning  - sore throat or hoarseness - Damage to heart, brain, nerves, lungs, other parts of body or loss of life  Patient voiced understanding.)        Anesthesia Quick Evaluation

## 2022-03-20 NOTE — H&P (Signed)
[place]   Primary Care Physician:  Coolidge Breeze, FNP Ophthalmologist: Dr. Merleen Nicely  Pre-Procedure History & Physical: HPI:  Jason Brooks. is a 67 y.o. male here for cataract surgery.   Past Medical History:  Diagnosis Date   Alcoholism ([place])    Aortic aneurysm (HCC)    Asthma    CHF (congestive heart failure) (HCC)    GERD (gastroesophageal reflux disease)    Hypertension    Liver cyst    Lung nodule    Pancreatitis    Vertigo    Wears dentures    full upper and lower.  doesn't wear    Past Surgical History:  Procedure Laterality Date   BIOPSY  06/06/2021   Procedure: BIOPSY;  Surgeon: [name], DO;  [facility];  Service: Endoscopy;;   CARDIAC CATHETERIZATION  09[facility]16/2021   [facility]   CARDIAC VALVE REPLACEMENT  09/22/2021   COLONOSCOPY WITH PROPOFOL N/A 06/06/2021   Procedure: COLONOSCOPY WITH PROPOFOL;  Surgeon: [name], DO;  [facility];  Service: Endoscopy;  Laterality: N/A;  1:45pm   ESOPHAGOGASTRODUODENOSCOPY (EGD) WITH PROPOFOL N/A 06/06/2021   Procedure: ESOPHAGOGASTRODUODENOSCOPY (EGD) WITH PROPOFOL;  Surgeon: [name], DO;  [facility];  Service: Endoscopy;  Laterality: N/A;   FOOT FRACTURE SURGERY Left    POLYPECTOMY  06/06/2021   Procedure: POLYPECTOMY INTESTINAL;  Surgeon: [name], DO;  [facility];  Service: Endoscopy;;   SHOULDER SURGERY Left     Prior to Admission medications   Medication Sig Start Date End Date Taking? Authorizing Provider  albuterol (PROAIR HFA) 108 (90 Base) MCG/ACT inhaler 2 puffs every 4 hours as needed only  if your can't catch your breath Patient taking differently: 2 puffs every 4 (four) hours as needed. 2 puffs every 4 hours as needed only  if your can't catch your breath 05/10/21  Yes Tanda Rockers, MD  ASPIRIN 81 PO Take by mouth daily.   Yes [provider]  atorvastatin (LIPITOR) 20 MG tablet Take 20 mg by mouth  daily. 04/28/21  Yes [provider]  empagliflozin (JARDIANCE) 10 MG TABS tablet Take 10 mg by mouth daily.   Yes [provider]  furosemide (LASIX) 40 MG tablet Take 40 mg by mouth daily.   Yes [provider]  metoprolol tartrate (LOPRESSOR) 25 MG tablet Take 25 mg by mouth 2 (two) times daily. 04/28/21  Yes [provider]  omeprazole (PRILOSEC) 20 MG capsule Take 1 capsule (20 mg total) by mouth 2 (two) times daily before a meal. Patient taking differently: Take 20 mg by mouth daily. 06/06/21 03/14/22 Yes Carver, Charles K, DO  sacubitril-valsartan (ENTRESTO) 24-26 MG Take 1 tablet by mouth 2 (two) times daily.   Yes [provider]  spironolactone (ALDACTONE) 25 MG tablet Take 25 mg by mouth daily.   Yes [provider]  ibuprofen (ADVIL) 200 MG tablet Take 400-600 mg by mouth every 6 (six) hours as needed for mild pain, moderate pain or headache.    [provider]    Allergies as of 02/13/2022 - Review Complete 06/06/2021  Allergen Reaction Noted   Codeine Hives 03/10/2021    Family History  Problem Relation Age of Onset   Colon cancer Neg Hx    Colon polyps Neg Hx     Social History   Socioeconomic History   Marital status: Single    Spouse name: Not on file  Number of children: Not on file   Years of education: Not on file   Highest education level: Not on file  Occupational History   Not on file  Tobacco Use   Smoking status: Every Day    Packs/day: 0.50    Years: 53.00    Total pack years: 26.50    Types: Cigarettes   Smokeless tobacco: Not on file   Tobacco comments:    Started smoking age 26  Vaping Use   Vaping Use: Never used  Substance and Sexual Activity   Alcohol use: Yes    Alcohol/week: 14.0 standard drinks of alcohol    Types: 14 Cans of beer per week    Comment: 1-4 beers per day   Drug use: Never   Sexual activity: Not on file  Other Topics Concern   Not on file  Social History  Narrative   Not on file   Social Determinants of Health   Financial Resource Strain: Not on file  Food Insecurity: Not on file  Transportation Needs: Not on file  Physical Activity: Not on file  Stress: Not on file  Social Connections: Not on file  Intimate Partner Violence: Not on file    Review of Systems: See HPI, otherwise negative ROS  Physical Exam: Ht '5\' 4"'$  (1.626 m)   Wt 62.1 kg   BMI 23.52 kg/m  General:   Alert, cooperative in NAD Head:  Normocephalic and atraumatic. Respiratory:  Normal work of breathing. Cardiovascular:  RRR  Impression/Plan: Jason Brooks. is here for cataract surgery.  Risks, benefits, limitations, and alternatives regarding cataract surgery have been reviewed with the patient.  Questions have been answered.  All parties agreeable.   Jason Richards, MD  03/20/2022, 7:05 AM

## 2022-03-20 NOTE — Op Note (Signed)
OPERATIVE NOTE  Jason Brooks [id number] 03/20/2022   PREOPERATIVE DIAGNOSIS: Nuclear sclerotic cataract right eye. H25.11   POSTOPERATIVE DIAGNOSIS: Nuclear sclerotic cataract right eye. H25.11   PROCEDURE:  Phacoemusification with posterior chamber intraocular lens placement of the right eye  Ultrasound time: Procedure(s): CATARACT EXTRACTION PHACO AND INTRAOCULAR LENS PLACEMENT (IOC) RIGHT OMIDRIA  7.14  00:41.4 (Right)  LENS:   Implant Name Type Inv. Item Serial No. Manufacturer Lot No. LRB No. Used Action  LENS IOL TECNIS EYHANCE 17.5 - [id number] Intraocular Lens LENS IOL TECNIS EYHANCE 17.5 [id number] [employer]  Right 1 Implanted      SURGEON:  Courtney Heys. Lazarus Salines, MD   ANESTHESIA:  Topical with tetracaine drops, augmented with 1% preservative-free intracameral lidocaine.   COMPLICATIONS:  None.   DESCRIPTION OF PROCEDURE:  The patient was identified in the holding room and transported to the operating room and placed in the supine position under the operating microscope.  The right eye was identified as the operative eye, which was prepped and draped in the usual sterile ophthalmic fashion.   A 1 millimeter clear-corneal paracentesis was made superotemporally. Preservative-free 1% lidocaine mixed with 1:1,000 bisulfite-free aqueous solution of epinephrine was injected into the anterior chamber. The anterior chamber was then filled with Viscoat viscoelastic. A 2.4 millimeter keratome was used to make a clear-corneal incision inferotemporally. A curvilinear capsulorrhexis was made with a cystotome and capsulorrhexis forceps. Balanced salt solution was used to hydrodissect and hydrodelineate the nucleus. Phacoemulsification was then used to remove the lens nucleus and epinucleus. The remaining cortex was then removed using the irrigation and aspiration handpiece. Provisc was then placed into the capsular bag to distend it for lens placement. A +17.50 D DIB00 intraocular lens was then  injected into the capsular bag. The remaining viscoelastic was aspirated.   Wounds were hydrated with balanced salt solution.  The anterior chamber was inflated to a physiologic pressure with balanced salt solution.  No wound leaks were noted. Vigamox was injected intracamerally.  Timolol and Brimonidine drops were applied to the eye.  The patient was taken to the recovery room in stable condition without complications of anesthesia or surgery.  [name] 03/20/2022, 9:55 AM

## 2022-03-20 NOTE — Transfer of Care (Signed)
Immediate Anesthesia Transfer of Care Note  Patient: Jason Brooks.  Procedure(s) Performed: CATARACT EXTRACTION PHACO AND INTRAOCULAR LENS PLACEMENT (IOC) RIGHT OMIDRIA  7.14  00:41.4 (Right: Eye)  Patient Location: PACU  Anesthesia Type: General  Level of Consciousness: awake, alert  and patient cooperative  Airway and Oxygen Therapy: Patient Spontanous Breathing and Patient connected to supplemental oxygen  Post-op Assessment: Post-op Vital signs reviewed, Patient's Cardiovascular Status Stable, Respiratory Function Stable, Patent Airway and No signs of Nausea or vomiting  Post-op Vital Signs: Reviewed and stable  Complications: No notable events documented.

## 2022-03-21 ENCOUNTER — Encounter: Payer: Self-pay | Admitting: Ophthalmology

## 2022-07-30 ENCOUNTER — Other Ambulatory Visit: Payer: Self-pay

## 2022-07-30 ENCOUNTER — Emergency Department (HOSPITAL_COMMUNITY): Payer: 59

## 2022-07-30 ENCOUNTER — Encounter (HOSPITAL_COMMUNITY): Payer: Self-pay | Admitting: Emergency Medicine

## 2022-07-30 ENCOUNTER — Emergency Department (HOSPITAL_COMMUNITY)
Admission: EM | Admit: 2022-07-30 | Discharge: 2022-07-30 | Disposition: A | Discharge status: Home / Self Care | Payer: 59 | Source: Home / Self Care | Attending: Emergency Medicine | Admitting: Emergency Medicine

## 2022-07-30 DIAGNOSIS — I2510 Atherosclerotic heart disease of native coronary artery without angina pectoris: Secondary | ICD-10-CM | POA: Diagnosis not present

## 2022-07-30 DIAGNOSIS — F17200 Nicotine dependence, unspecified, uncomplicated: Secondary | ICD-10-CM | POA: Insufficient documentation

## 2022-07-30 DIAGNOSIS — I509 Heart failure, unspecified: Secondary | ICD-10-CM | POA: Diagnosis not present

## 2022-07-30 DIAGNOSIS — I7121 Aneurysm of the ascending aorta, without rupture: Secondary | ICD-10-CM | POA: Diagnosis not present

## 2022-07-30 DIAGNOSIS — R911 Solitary pulmonary nodule: Secondary | ICD-10-CM | POA: Insufficient documentation

## 2022-07-30 DIAGNOSIS — Z7982 Long term (current) use of aspirin: Secondary | ICD-10-CM | POA: Diagnosis not present

## 2022-07-30 DIAGNOSIS — R079 Chest pain, unspecified: Secondary | ICD-10-CM | POA: Diagnosis present

## 2022-07-30 LAB — CBC
HCT: 39.4 % (ref 39.0–52.0)
Hemoglobin: 13.9 g/dL (ref 13.0–17.0)
MCH: 32.6 pg (ref 26.0–34.0)
MCHC: 35.3 g/dL (ref 30.0–36.0)
MCV: 92.3 fL (ref 80.0–100.0)
Platelets: 219 10*3/uL (ref 150–400)
RBC: 4.27 MIL/uL (ref 4.22–5.81)
RDW: 12.4 % (ref 11.5–15.5)
WBC: 5.9 10*3/uL (ref 4.0–10.5)
nRBC: 0 % (ref 0.0–0.2)

## 2022-07-30 LAB — BASIC METABOLIC PANEL
Anion gap: 10 (ref 5–15)
BUN: 11 mg/dL (ref 8–23)
CO2: 24 mmol/L (ref 22–32)
Calcium: 9.1 mg/dL (ref 8.9–10.3)
Chloride: 104 mmol/L (ref 98–111)
Creatinine, Ser: 0.88 mg/dL (ref 0.61–1.24)
GFR, Estimated: >60 mL/min (ref >60)
Glucose, Bld: 107 mg/dL — ABNORMAL HIGH (ref 70–99)
Potassium: 4 mmol/L (ref 3.5–5.1)
Sodium: 138 mmol/L (ref 135–145)

## 2022-07-30 LAB — ETHANOL: Alcohol, Ethyl (B): 145 mg/dL — ABNORMAL HIGH (ref <10)

## 2022-07-30 LAB — TROPONIN I (HIGH SENSITIVITY)
Troponin I (High Sensitivity): 12 ng/L (ref <18)
Troponin I (High Sensitivity): 12 ng/L (ref <18)

## 2022-07-30 LAB — PROTIME-INR
INR: 1 (ref 0.8–1.2)
Prothrombin Time: 13.5 seconds (ref 11.4–15.2)

## 2022-07-30 MED ORDER — IOHEXOL 350 MG/ML SOLN
100.0000 mL | Freq: Once | INTRAVENOUS | Status: AC | PRN
  Administered 2022-07-30: 80 mL via INTRAVENOUS

## 2022-07-30 NOTE — ED Notes (Signed)
Pt walked out of room with family member, holding gauze over recent IV Site. Pt states he is leaving and he took his own IV out. Pt's Primary Nurse politely asked the Pt to wait for his discharge paperwork, but Pt refused to return to treatment room and continued to walk past the Nurse Station and leave the department with out reviewing his discharge paperwork or discharge instruction.

## 2022-07-30 NOTE — ED Provider Notes (Signed)
[place] EMERGENCY DEPARTMENT AT [facility] Provider Note   CSN: [id number] Arrival date & time: 07/30/22  1958     History  Chief Complaint  Patient presents with   Chest Pain    Jason Zheng. is a 67 y.o. male.  He has a history of tobacco use coronary disease aortic stenosis status post TAVR, ascending aortic aneurysm, CHF.  He has had increased intermittent chest pain for the last few days and also complaining of hiccups.  He tells me he gets chest pain very frequently but for the last few days it was more frequent.  Continues to smoke and drink alcohol daily.  The history is provided by the patient and a relative.  Chest Pain Pain location:  Substernal area Pain quality: aching   Pain radiates to:  Does not radiate Pain severity:  Moderate Onset quality:  Gradual Duration:  3 days Timing:  Intermittent Progression:  Unchanged Chronicity:  Recurrent Relieved by:  None tried Worsened by:  Nothing Ineffective treatments:  None tried Associated symptoms: no dysphagia, no fever, no nausea, no shortness of breath and no vomiting   Risk factors: aortic disease and coronary artery disease        Home Medications Prior to Admission medications   Medication Sig Start Date End Date Taking? Authorizing Provider  albuterol (PROAIR HFA) 108 (90 Base) MCG/ACT inhaler 2 puffs every 4 hours as needed only  if your can't catch your breath Patient taking differently: 2 puffs every 4 (four) hours as needed. 2 puffs every 4 hours as needed only  if your can't catch your breath 05/10/21   Nyoka Cowden, MD  ASPIRIN 81 PO Take by mouth daily.    [provider]  atorvastatin (LIPITOR) 20 MG tablet Take 20 mg by mouth daily. 04/28/21   [provider]  empagliflozin (JARDIANCE) 10 MG TABS tablet Take 10 mg by mouth daily.    [provider]  furosemide (LASIX) 40 MG tablet Take 40 mg by mouth daily.    [provider]  ibuprofen (ADVIL)  200 MG tablet Take 400-600 mg by mouth every 6 (six) hours as needed for mild pain, moderate pain or headache.    [provider]  metoprolol tartrate (LOPRESSOR) 25 MG tablet Take 25 mg by mouth 2 (two) times daily. 04/28/21   [provider]  omeprazole (PRILOSEC) 20 MG capsule Take 1 capsule (20 mg total) by mouth 2 (two) times daily before a meal. Patient taking differently: Take 20 mg by mouth daily. 06/06/21 03/14/22  Lanelle Bal, DO  sacubitril-valsartan (ENTRESTO) 24-26 MG Take 1 tablet by mouth 2 (two) times daily.    [provider]  spironolactone (ALDACTONE) 25 MG tablet Take 25 mg by mouth daily.    [provider]      Allergies    Codeine    Review of Systems   Review of Systems  Constitutional:  Negative for fever.  HENT:  Negative for trouble swallowing.   Respiratory:  Negative for shortness of breath.   Cardiovascular:  Positive for chest pain.  Gastrointestinal:  Negative for nausea and vomiting.    Physical Exam Updated Vital Signs BP (!) 174/97   Pulse 79   Temp 98.2 F (36.8 C) (Oral)   Resp 17   Ht 5\' 6"  (1.676 m)   Wt 61.2 kg   SpO2 95%   BMI 21.79 kg/m  Physical Exam Vitals and nursing note reviewed.  Constitutional:  General: He is not in acute distress.    Appearance: He is well-developed.  HENT:     Head: Normocephalic and atraumatic.  Eyes:     Conjunctiva/sclera: Conjunctivae normal.  Cardiovascular:     Rate and Rhythm: Normal rate and regular rhythm.     Heart sounds: Normal heart sounds. No murmur heard. Pulmonary:     Effort: Pulmonary effort is normal. No respiratory distress.     Breath sounds: Normal breath sounds.  Abdominal:     Palpations: Abdomen is soft.     Tenderness: There is no abdominal tenderness.  Musculoskeletal:        General: No swelling. Normal range of motion.     Cervical back: Neck supple.     Right lower leg: No tenderness. No edema.     Left lower leg: No  tenderness. No edema.  Skin:    General: Skin is warm and dry.     Capillary Refill: Capillary refill takes less than 2 seconds.  Neurological:     General: No focal deficit present.     Mental Status: He is alert.     ED Results / Procedures / Treatments   Labs (all labs ordered are listed, but only abnormal results are displayed) Labs Reviewed  BASIC METABOLIC PANEL - Abnormal; Notable for the following components:      Result Value   Glucose, Bld 107 (*)    All other components within normal limits  ETHANOL - Abnormal; Notable for the following components:   Alcohol, Ethyl (B) 145 (*)    All other components within normal limits  CBC  PROTIME-INR  TROPONIN I (HIGH SENSITIVITY)  TROPONIN I (HIGH SENSITIVITY)    EKG EKG Interpretation Date/Time:  Sunday July 30 2022 20:13:58 EDT Ventricular Rate:  64 PR Interval:  168 QRS Duration:  86 QT Interval:  412 QTC Calculation: 425 R Axis:   53  Text Interpretation: Normal sinus rhythm Septal infarct , age undetermined Abnormal ECG When compared with ECG of 02-Jun-2021 11:47, Septal infarct is now Present ST no longer depressed in Inferior leads ST no longer depressed in Lateral leads T wave inversion no longer evident in Inferior leads T wave inversion no longer evident in Anterolateral leads Confirmed by Meridee Score [phone]) on 07/30/2022 8:18:45 PM  Radiology CT Angio Chest Aorta W and/or Wo Contrast  Result Date: 07/30/2022 CLINICAL DATA:  Acute aortic syndrome suspected.  Dizziness. EXAM: CT ANGIOGRAPHY CHEST WITH CONTRAST TECHNIQUE: Multidetector CT imaging of the chest was performed using the standard protocol during bolus administration of intravenous contrast. Multiplanar CT image reconstructions and MIPs were obtained to evaluate the vascular anatomy. RADIATION DOSE REDUCTION: This exam was performed according to the departmental dose-optimization program which includes automated exposure control, adjustment of the mA  and/or kV according to patient size and/or use of iterative reconstruction technique. CONTRAST:  80mL OMNIPAQUE IOHEXOL 350 MG/ML SOLN COMPARISON:  CT of the chest 03/06/2021 FINDINGS: Cardiovascular: There is a ascending aortic aneurysm measuring 4.3 by 4.4 cm similar to the prior study. There is no evidence for dissection. There is mild calcified atherosclerotic disease throughout the aorta. Origin of the great vessels demonstrates conventional anatomy. There is mild focal stenosis of the origin of the left common carotid artery secondary to noncalcified atherosclerotic disease. Aortic valve replacement noted. The heart is mildly enlarged. There is no pericardial effusion. Mediastinum/Nodes: No enlarged mediastinal, hilar, or axillary lymph nodes. Thyroid gland, trachea, and esophagus demonstrate no significant findings. Lungs/Pleura: Minimal emphysematous  changes are present in the lung apices. There is a ground-glass nodule in the right upper lobe measuring 12 x 10 mm similar to the prior study. Borders are slightly spiculated. The lungs are otherwise clear. There is no pleural effusion or pneumothorax. Upper Abdomen: No acute findings. There is a subcentimeter hypodensity in the dome of the liver which is too small to characterize and unchanged, favored as a small cyst or hemangioma. Musculoskeletal: No chest wall abnormality. No acute or significant osseous findings. Review of the MIP images confirms the above findings. IMPRESSION: 1. No evidence for aortic dissection. 2. Stable ascending aortic aneurysm measuring 4.3 x 4.4 cm. Recommend annual imaging followup by CTA or MRA. This recommendation follows 2010 ACCF/AHA/AATS/ACR/ASA/SCA/SCAI/SIR/STS/SVM Guidelines for the Diagnosis and Management of Patients with Thoracic Aortic Disease. Circulation. 2010; 121: G401-U272. Aortic aneurysm NOS (ICD10-I71.9) 3. 12 x 10 mm ground-glass nodule in the right upper lobe. Adenocarcinoma cannot be excluded. Follow up by CT  is recommended in 12 months, with continued annual surveillance for a minimum of 3 years. These recommendations are taken from: Recommendations for the Management of Subsolid Pulmonary Nodules Detected at CT: A Statement from the Fleischner Society Radiology 2013; 266:1, 304-317. 4. Mild focal stenosis of the origin of the left common carotid artery secondary to noncalcified atherosclerotic disease. 5. Mild cardiomegaly. Aortic Atherosclerosis (ICD10-I70.0). Electronically Signed   By: Darliss Cheney M.D.   On: 07/30/2022 22:44   DG Chest Port 1 View  Result Date: 07/30/2022 CLINICAL DATA:  Chest pain and shortness breath EXAM: PORTABLE CHEST 1 VIEW COMPARISON:  CT chest 03/28/2009 FINDINGS: Normal cardiomediastinal silhouette. TAVR. Aortic atherosclerotic calcification. No focal consolidation, pleural effusion, or pneumothorax. No displaced rib fractures. Postoperative changes left humerus. IMPRESSION: No active disease. Electronically Signed   By: Minerva Fester M.D.   On: 07/30/2022 21:12    Procedures Procedures    Medications Ordered in ED Medications  iohexol (OMNIPAQUE) 350 MG/ML injection 100 mL (80 mLs Intravenous Contrast Given 07/30/22 2226)    ED Course/ Medical Decision Making/ A&P Clinical Course as of 07/31/22 0915  Sun Jul 30, 2022  2301 Patient's troponin are negative and his CT showing no worsening of his aneurysm no evidence of dissection.  He is eating chicken nuggets and his vital signs are stable other than elevated blood pressure.  He is appropriate for discharge and can follow-up with his PCP.  I reviewed this with the patient and his daughter [MB]    Clinical Course User Index [MB] Terrilee Files, MD                             Medical Decision Making Amount and/or Complexity of Data Reviewed Labs: ordered. Radiology: ordered.  Risk Prescription drug management.   This patient complains of chest pain hiccups; this involves an extensive number of  treatment Options and is a complaint that carries with it a high risk of complications and morbidity. The differential includes ACS, pneumonia, reflux, aneurysmal rupture, PE, dissection,, intoxication  I ordered, reviewed and interpreted labs, which included CBC normal chemistries normal other than mildly elevated glucose, troponins flat, alcohol level elevated I ordered imaging studies which included chest x-ray and CT angio chest and I independently    visualized and interpreted imaging which showed stable aneurysm, pulmonary nodule Additional history obtained from patient's family member Previous records obtained and reviewed in epic including recent cardiology notes from [facility] Cardiac monitoring reviewed, normal sinus rhythm  Social determinants considered, tobacco use Critical Interventions: None  After the interventions stated above, I reevaluated the patient and found patient to be hemodynamically stable resting comfortably Admission and further testing considered, no indications for admission at this time.  Recommended patient continue his medications and follow-up with his cardiology team.  Counseled patient to limit tobacco and alcohol use.  Return instructions discussed.         Final Clinical Impression(s) / ED Diagnoses Final diagnoses:  Nonspecific chest pain  Aneurysm of ascending aorta without rupture [facility])  Pulmonary nodule    Rx / DC Orders ED Discharge Orders     None         Terrilee Files, MD 07/31/22 [phone]

## 2022-07-30 NOTE — Discharge Instructions (Signed)
You were seen in the emergency department for chest pain.  You had cardiac enzymes that showed no evidence of heart attack.  Your CAT scan showed that your aneurysm was not enlarging but was stable.  You also had a pulmonary nodule that we will need follow-up.  Please contact your cardiology team for close follow-up.  Continue your regular medications.  Will be important for you to stop smoking and drinking alcohol.

## 2022-07-30 NOTE — ED Triage Notes (Signed)
Pt via POV c/o central chest pain intermittently, nonradiating, rated 7/10 at worst, with dizziness. PMH includes cardiac stents and heart surgery.

## 2023-06-22 ENCOUNTER — Encounter (HOSPITAL_COMMUNITY): Payer: Self-pay

## 2023-06-22 ENCOUNTER — Other Ambulatory Visit: Payer: Self-pay

## 2023-06-22 ENCOUNTER — Emergency Department (HOSPITAL_COMMUNITY)

## 2023-06-22 ENCOUNTER — Emergency Department (HOSPITAL_COMMUNITY)
Admission: EM | Admit: 2023-06-22 | Discharge: 2023-06-23 | Disposition: A | Discharge status: Home / Self Care | Attending: Emergency Medicine | Admitting: Emergency Medicine

## 2023-06-22 DIAGNOSIS — I2510 Atherosclerotic heart disease of native coronary artery without angina pectoris: Secondary | ICD-10-CM | POA: Diagnosis not present

## 2023-06-22 DIAGNOSIS — Z7982 Long term (current) use of aspirin: Secondary | ICD-10-CM | POA: Diagnosis not present

## 2023-06-22 DIAGNOSIS — R55 Syncope and collapse: Secondary | ICD-10-CM | POA: Diagnosis present

## 2023-06-22 DIAGNOSIS — D72829 Elevated white blood cell count, unspecified: Secondary | ICD-10-CM | POA: Diagnosis not present

## 2023-06-22 DIAGNOSIS — Z79899 Other long term (current) drug therapy: Secondary | ICD-10-CM | POA: Insufficient documentation

## 2023-06-22 DIAGNOSIS — R001 Bradycardia, unspecified: Secondary | ICD-10-CM | POA: Diagnosis not present

## 2023-06-22 DIAGNOSIS — I110 Hypertensive heart disease with heart failure: Secondary | ICD-10-CM | POA: Diagnosis not present

## 2023-06-22 DIAGNOSIS — I509 Heart failure, unspecified: Secondary | ICD-10-CM | POA: Insufficient documentation

## 2023-06-22 LAB — COMPREHENSIVE METABOLIC PANEL WITH GFR
ALT: 22 U/L (ref 0–44)
AST: 25 U/L (ref 15–41)
Albumin: 3.7 g/dL (ref 3.5–5.0)
Alkaline Phosphatase: 86 U/L (ref 38–126)
Anion gap: 8 (ref 5–15)
BUN: 14 mg/dL (ref 8–23)
CO2: 19 mmol/L — ABNORMAL LOW (ref 22–32)
Calcium: 8.4 mg/dL — ABNORMAL LOW (ref 8.9–10.3)
Chloride: 107 mmol/L (ref 98–111)
Creatinine, Ser: 0.78 mg/dL (ref 0.61–1.24)
GFR, Estimated: >60 mL/min (ref >60)
Glucose, Bld: 118 mg/dL — ABNORMAL HIGH (ref 70–99)
Potassium: 4 mmol/L (ref 3.5–5.1)
Sodium: 134 mmol/L — ABNORMAL LOW (ref 135–145)
Total Bilirubin: 0.7 mg/dL (ref 0.0–1.2)
Total Protein: 6.5 g/dL (ref 6.5–8.1)

## 2023-06-22 LAB — CBC WITH DIFFERENTIAL/PLATELET
Abs Immature Granulocytes: 0.07 10*3/uL (ref 0.00–0.07)
Basophils Absolute: 0.1 10*3/uL (ref 0.0–0.1)
Basophils Relative: 1 %
Eosinophils Absolute: 0.3 10*3/uL (ref 0.0–0.5)
Eosinophils Relative: 2 %
HCT: 33.8 % — ABNORMAL LOW (ref 39.0–52.0)
Hemoglobin: 11.9 g/dL — ABNORMAL LOW (ref 13.0–17.0)
Immature Granulocytes: 1 %
Lymphocytes Relative: 13 %
Lymphs Abs: 1.6 10*3/uL (ref 0.7–4.0)
MCH: 33.2 pg (ref 26.0–34.0)
MCHC: 35.2 g/dL (ref 30.0–36.0)
MCV: 94.4 fL (ref 80.0–100.0)
Monocytes Absolute: 0.6 10*3/uL (ref 0.1–1.0)
Monocytes Relative: 5 %
Neutro Abs: 9.6 10*3/uL — ABNORMAL HIGH (ref 1.7–7.7)
Neutrophils Relative %: 78 %
Platelets: 217 10*3/uL (ref 150–400)
RBC: 3.58 MIL/uL — ABNORMAL LOW (ref 4.22–5.81)
RDW: 12.6 % (ref 11.5–15.5)
WBC: 12.2 10*3/uL — ABNORMAL HIGH (ref 4.0–10.5)
nRBC: 0 % (ref 0.0–0.2)

## 2023-06-22 LAB — URINALYSIS, ROUTINE W REFLEX MICROSCOPIC
Bilirubin Urine: NEGATIVE
Glucose, UA: NEGATIVE mg/dL
Hgb urine dipstick: NEGATIVE
Ketones, ur: NEGATIVE mg/dL
Leukocytes,Ua: NEGATIVE
Nitrite: NEGATIVE
Protein, ur: NEGATIVE mg/dL
Specific Gravity, Urine: 1.005 (ref 1.005–1.030)
pH: 5 (ref 5.0–8.0)

## 2023-06-22 LAB — TROPONIN I (HIGH SENSITIVITY): Troponin I (High Sensitivity): 6 ng/L (ref <18)

## 2023-06-22 LAB — BRAIN NATRIURETIC PEPTIDE: B Natriuretic Peptide: 19 pg/mL (ref 0.0–100.0)

## 2023-06-22 NOTE — ED Provider Notes (Signed)
 [place] EMERGENCY DEPARTMENT AT [facility] Provider Note   CSN: [id number] Arrival date & time: 06/22/23  2049     History  Chief Complaint  Patient presents with   Loss of Consciousness    Jason Brooks. is a 68 y.o. male with a history including hypertension, CHF, CAD, known aortic aneurysm and history of alcoholism, endorsing he drinks 60 ounces of beer every day, presenting for an episode of syncope.  He states he was sitting at his computer for about 40 minutes and he got up and walked 5 or 6 steps after which he felt lightheaded and passed out.  He denies any complaint of pain or injury with this event.  The fall was witnessed by family who made him come in for evaluation.  He states he has had several similar episodes in recent weeks but has not sought medical care.  Medical history is also significant for aortic valve replacement and has had cardiac stent placement, care obtained by [facility].  EMS stated patient was orthostatic on arrival, they gave him a liter of IV fluids en route.  Patient has no complaints at this time.  The history is provided by the patient.       Home Medications Prior to Admission medications   Medication Sig Start Date End Date Taking? Authorizing Provider  albuterol  (PROAIR  HFA) 108 (90 Base) MCG/ACT inhaler 2 puffs every 4 hours as needed only  if your can't catch your breath Patient taking differently: 2 puffs every 4 (four) hours as needed. 2 puffs every 4 hours as needed only  if your can't catch your breath 05/10/21   Diamond Formica, MD  aspirin EC 81 MG tablet Take 81 mg by mouth daily.    [provider]  atorvastatin (LIPITOR) 80 MG tablet Take 80 mg by mouth daily.    [provider]  empagliflozin (JARDIANCE) 10 MG TABS tablet Take 10 mg by mouth daily.    [provider]  ENTRESTO 49-51 MG Take 1 tablet by mouth 2 (two) times daily.    [provider]  furosemide (LASIX) 40 MG tablet Take 40 mg  by mouth daily.    [provider]  ibuprofen (ADVIL) 200 MG tablet Take 400-600 mg by mouth every 6 (six) hours as needed for mild pain, moderate pain or headache.    [provider]  metoprolol tartrate (LOPRESSOR) 25 MG tablet Take 25 mg by mouth 2 (two) times daily. 04/28/21   [provider]  nicotine (NICODERM CQ - DOSED IN MG/24 HOURS) 14 mg/24hr patch Place 1 patch onto the skin daily. 05/25/22   [provider]  omeprazole  (PRILOSEC) 20 MG capsule Take 1 capsule (20 mg total) by mouth 2 (two) times daily before a meal. Patient taking differently: Take 20 mg by mouth daily. 06/06/21 03/14/22  Carver, Charles K, DO  spironolactone (ALDACTONE) 25 MG tablet Take 25 mg by mouth daily.    [provider]      Allergies    Borax and Codeine    Review of Systems   Review of Systems  Constitutional:  Negative for fever.  HENT:  Negative for congestion and sore throat.   Eyes: Negative.   Respiratory:  Negative for chest tightness and shortness of breath.   Cardiovascular:  Negative for chest pain.  Gastrointestinal:  Negative for abdominal pain and nausea.  Genitourinary: Negative.   Musculoskeletal:  Negative for arthralgias, joint swelling and neck pain.  Skin: Negative.  Negative for rash and wound.  Neurological:  Positive for syncope. Negative for dizziness, weakness, light-headedness, numbness and headaches.  Psychiatric/Behavioral: Negative.      Physical Exam Updated Vital Signs BP 111/67   Pulse 63   Temp 97.7 F (36.5 C) (Oral)   Resp 17   Ht 5\' 8"  (1.727 m)   Wt 61.2 kg   SpO2 95%   BMI 20.53 kg/m  Physical Exam Vitals and nursing note reviewed.  Constitutional:      Appearance: He is well-developed.  HENT:     Head: Normocephalic and atraumatic.  Eyes:     Conjunctiva/sclera: Conjunctivae normal.  Cardiovascular:     Rate and Rhythm: Regular rhythm. Bradycardia present.     Heart sounds: Normal heart sounds.   Pulmonary:     Effort: Pulmonary effort is normal.     Breath sounds: Normal breath sounds. No wheezing.  Abdominal:     General: Bowel sounds are normal.     Palpations: Abdomen is soft.     Tenderness: There is no abdominal tenderness.  Musculoskeletal:        General: Normal range of motion.     Cervical back: Normal range of motion.  Skin:    General: Skin is warm and dry.  Neurological:     Mental Status: He is alert.     ED Results / Procedures / Treatments   Labs (all labs ordered are listed, but only abnormal results are displayed) Labs Reviewed  CBC WITH DIFFERENTIAL/PLATELET - Abnormal; Notable for the following components:      Result Value   WBC 12.2 (*)    RBC 3.58 (*)    Hemoglobin 11.9 (*)    HCT 33.8 (*)    Neutro Abs 9.6 (*)    All other components within normal limits  COMPREHENSIVE METABOLIC PANEL WITH GFR - Abnormal; Notable for the following components:   Sodium 134 (*)    CO2 19 (*)    Glucose, Bld 118 (*)    Calcium 8.4 (*)    All other components within normal limits  URINALYSIS, ROUTINE W REFLEX MICROSCOPIC - Abnormal; Notable for the following components:   Color, Urine STRAW (*)    All other components within normal limits  BRAIN NATRIURETIC PEPTIDE  CBG MONITORING, ED  TROPONIN I (HIGH SENSITIVITY)  TROPONIN I (HIGH SENSITIVITY)    EKG EKG Interpretation Date/Time:  Friday June 22 2023 21:00:13 EDT Ventricular Rate:  50 PR Interval:  182 QRS Duration:  95 QT Interval:  466 QTC Calculation: 425 R Axis:   8  Text Interpretation: Sinus rhythm Confirmed by Eve Hinders [id number]) on 06/22/2023 11:19:23 PM  Radiology DG Chest Port 1 View Result Date: 06/22/2023 CLINICAL DATA:  Recent syncopal episode EXAM: PORTABLE CHEST 1 VIEW COMPARISON:  07/30/2022 FINDINGS: Cardiac shadow is within normal limits. TAVR is seen. Aortic calcifications are noted. Lungs are well aerated. No focal infiltrate or effusion is noted. No acute bony abnormality is  seen. IMPRESSION: No active disease. Electronically Signed   By: Violeta Grey M.D.   On: 06/22/2023 22:32    Procedures Procedures    Medications Ordered in ED Medications - No data to display  ED Course/ Medical Decision Making/ A&P                                 Medical Decision Making Pt presenting with episode of syncope prior  to arrival, describes lightheadness prodrome after sitting for about 45 minutes. He endorses 2 other syncopal events in the past 3 weeks.  He does state his metoprolol was increased last month from 25 mg to 40 mg bid. Was found to be bradycardic here raising concern for sx being metoprolol adverse reaction.  No cp, no palpitations, also no complaint of headache.  Pt received 750 cc NS enroute as was orthostatic in the field,  orthostatics here were ok with no complaint of dizziness, ambulated in the dept at felt at baseline as well.  No arrhythmia while monitored here, denies cp, sob - doubt unstable angina, chf exacerbation, PE.   Advised to hold metoprolol until he talks to his provider on Monday.  Will also have him hold his lasix x 3 days,  resuming on 6/10.   Amount and/or Complexity of Data Reviewed External Data Reviewed: radiology.    Details: Summary   1. The left ventricle is normal in size with normal wall thickness.    2. The left ventricular systolic function is normal, LVEF is visually  estimated at > 55%.    3. 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   4. Aortic valve Doppler indices are consistent with normal prosthetic valve  function.   5. The right ventricle is normal in size, with normal systolic function.    Labs: ordered.    Details: Reviewed, urinalysis, BNP, troponin, c-Met with no significant electrolyte abnormalities, his CBC also reviewed, he does have a leukocytosis at 12.2, hemoglobin of 11.9, hemoglobins with [facility] have historically been in the mid 10 range although this was 13.9 in  our system 10 months ago. Radiology: ordered.    Details: Chest x-ray reviewed, no acute findings. ECG/medicine tests: ordered.    Details: Bradycardia with rate of 50.           Final Clinical Impression(s) / ED Diagnoses Final diagnoses:  Vasovagal syncope  Bradycardia    Rx / DC Orders ED Discharge Orders     None         Katherine Pancake, PA-C 06/23/23 0032

## 2023-06-22 NOTE — ED Provider Notes (Incomplete)
  EMERGENCY DEPARTMENT AT [facility] Provider Note   CSN: [id number] Arrival date & time: 06/22/23  2049     History {Add pertinent medical, surgical, social history, OB history to HPI:1} Chief Complaint  Patient presents with  . Loss of Consciousness    Jason Brooks. is a 68 y.o. male with a history including hypertension, CHF, CAD, known aortic aneurysm and history of alcoholism, endorsing he drinks 60 ounces of beer every day, presenting for an episode of syncope.  He states he was sitting at his computer for about 40 minutes and he got up and walked 5 or 6 steps after which he felt lightheaded and passed out.  He denies any complaint of pain or injury with this event.  The fall was witnessed by family who made him come in for evaluation.  He states he has had several similar episodes in recent weeks but has not sought medical care.  Medical history is also significant for aortic valve replacement and has had cardiac stent placement, care obtained by [facility].  EMS stated patient was orthostatic on arrival, they gave him a liter of IV fluids en route.  Patient has no complaints at this time.  The history is provided by the patient.       Home Medications Prior to Admission medications   Medication Sig Start Date End Date Taking? Authorizing Provider  albuterol  (PROAIR  HFA) 108 (90 Base) MCG/ACT inhaler 2 puffs every 4 hours as needed only  if your can't catch your breath Patient taking differently: 2 puffs every 4 (four) hours as needed. 2 puffs every 4 hours as needed only  if your can't catch your breath 05/10/21   Diamond Formica, MD  aspirin EC 81 MG tablet Take 81 mg by mouth daily.    [provider]  atorvastatin (LIPITOR) 80 MG tablet Take 80 mg by mouth daily.    [provider]  empagliflozin (JARDIANCE) 10 MG TABS tablet Take 10 mg by mouth daily.    [provider]  ENTRESTO 49-51 MG Take 1 tablet by mouth 2 (two) times daily.     [provider]  furosemide (LASIX) 40 MG tablet Take 40 mg by mouth daily.    [provider]  ibuprofen (ADVIL) 200 MG tablet Take 400-600 mg by mouth every 6 (six) hours as needed for mild pain, moderate pain or headache.    [provider]  metoprolol tartrate (LOPRESSOR) 25 MG tablet Take 25 mg by mouth 2 (two) times daily. 04/28/21   [provider]  nicotine (NICODERM CQ - DOSED IN MG/24 HOURS) 14 mg/24hr patch Place 1 patch onto the skin daily. 05/25/22   [provider]  omeprazole  (PRILOSEC) 20 MG capsule Take 1 capsule (20 mg total) by mouth 2 (two) times daily before a meal. Patient taking differently: Take 20 mg by mouth daily. 06/06/21 03/14/22  Carver, Charles K, DO  spironolactone (ALDACTONE) 25 MG tablet Take 25 mg by mouth daily.    [provider]      Allergies    Borax and Codeine    Review of Systems   Review of Systems  Constitutional:  Negative for fever.  HENT:  Negative for congestion and sore throat.   Eyes: Negative.   Respiratory:  Negative for chest tightness and shortness of breath.   Cardiovascular:  Negative for chest pain.  Gastrointestinal:  Negative for abdominal pain and nausea.  Genitourinary: Negative.   Musculoskeletal:  Negative for arthralgias, joint swelling and neck pain.  Skin: Negative.  Negative for rash and wound.  Neurological:  Positive for syncope. Negative for dizziness, weakness, light-headedness, numbness and headaches.  Psychiatric/Behavioral: Negative.      Physical Exam Updated Vital Signs BP 107/64   Pulse (!) 49   Temp 97.7 F (36.5 C) (Oral)   Resp 17   Ht 5\' 8"  (1.727 m)   Wt 61.2 kg   SpO2 95%   BMI 20.53 kg/m  Physical Exam Vitals and nursing note reviewed.  Constitutional:      Appearance: He is well-developed.  HENT:     Head: Normocephalic and atraumatic.  Eyes:     Conjunctiva/sclera: Conjunctivae normal.  Cardiovascular:     Rate and Rhythm: Regular  rhythm. Bradycardia present.     Heart sounds: Normal heart sounds.  Pulmonary:     Effort: Pulmonary effort is normal.     Breath sounds: Normal breath sounds. No wheezing.  Abdominal:     General: Bowel sounds are normal.     Palpations: Abdomen is soft.     Tenderness: There is no abdominal tenderness.  Musculoskeletal:        General: Normal range of motion.     Cervical back: Normal range of motion.  Skin:    General: Skin is warm and dry.  Neurological:     Mental Status: He is alert.     ED Results / Procedures / Treatments   Labs (all labs ordered are listed, but only abnormal results are displayed) Labs Reviewed  CBC WITH DIFFERENTIAL/PLATELET  COMPREHENSIVE METABOLIC PANEL WITH GFR  URINALYSIS, ROUTINE W REFLEX MICROSCOPIC  BRAIN NATRIURETIC PEPTIDE  CBG MONITORING, ED    EKG None  Radiology No results found.  Procedures Procedures  {Document cardiac monitor, telemetry assessment procedure when appropriate:1}  Medications Ordered in ED Medications - No data to display  ED Course/ Medical Decision Making/ A&P   {   Click here for ABCD2, HEART and other calculatorsREFRESH Note before signing :1}                              Medical Decision Making Amount and/or Complexity of Data Reviewed External Data Reviewed: radiology.    Details: Summary   1. The left ventricle is normal in size with normal wall thickness.    2. The left ventricular systolic function is normal, LVEF is visually  estimated at > 55%.    3. 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   4. Aortic valve Doppler indices are consistent with normal prosthetic valve  function.   5. The right ventricle is normal in size, with normal systolic function.    Labs: ordered. Radiology: ordered.     {Document critical care time when appropriate:1} {Document review of labs and clinical decision tools ie heart score, Chads2Vasc2 etc:1}  {Document  your independent review of radiology images, and any outside records:1} {Document your discussion with family members, caretakers, and with consultants:1} {Document social determinants of health affecting pt's care:1} {Document your decision making why or why not admission, treatments were needed:1} Final Clinical Impression(s) / ED Diagnoses Final diagnoses:  None    Rx / DC Orders ED Discharge Orders     None

## 2023-06-22 NOTE — ED Triage Notes (Signed)
 Pt was at home, sitting at his computer. He stood up from the computer and passed out. Hx of aortic valve replacement 09/22/2021. + [facility] per EMS.

## 2023-06-22 NOTE — ED Notes (Signed)
 Patient able to ambulate around room without difficulty.  Reports he "feels at his baseline."  No new additional dizziness noted.

## 2023-06-23 LAB — TROPONIN I (HIGH SENSITIVITY): Troponin I (High Sensitivity): 6 ng/L (ref <18)

## 2023-06-23 NOTE — Discharge Instructions (Addendum)
 Hold your lasix (fluid pill) for the next 3 days,  starting it back on Tuesday as it may be causing you to be dehydrated as the reason for your passing out today.  Also,  your pulse rate is low which can also cause you to pass out - your metoprolol causes your heart rate to be low - please hold this medicine too until you are re-evaluated by Dr.Skaf. Call his office Monday morning for further advice from him about when to restart this medicine.

## 2023-06-23 NOTE — ED Notes (Signed)
 Patient called family member for discharge home.  Reports they will be here within an hour

## 2023-07-17 ENCOUNTER — Other Ambulatory Visit (HOSPITAL_COMMUNITY): Payer: Self-pay | Admitting: Internal Medicine

## 2023-07-17 DIAGNOSIS — R918 Other nonspecific abnormal finding of lung field: Secondary | ICD-10-CM

## 2023-08-01 ENCOUNTER — Emergency Department (HOSPITAL_COMMUNITY)
Admission: EM | Admit: 2023-08-01 | Discharge: 2023-08-01 | Disposition: A | Discharge status: Home / Self Care | Attending: Student | Admitting: Student

## 2023-08-01 ENCOUNTER — Emergency Department (HOSPITAL_COMMUNITY)

## 2023-08-01 ENCOUNTER — Other Ambulatory Visit: Payer: Self-pay

## 2023-08-01 ENCOUNTER — Encounter (HOSPITAL_COMMUNITY): Payer: Self-pay

## 2023-08-01 DIAGNOSIS — Z91148 Patient's other noncompliance with medication regimen for other reason: Secondary | ICD-10-CM | POA: Insufficient documentation

## 2023-08-01 DIAGNOSIS — R42 Dizziness and giddiness: Secondary | ICD-10-CM | POA: Diagnosis present

## 2023-08-01 DIAGNOSIS — Z79899 Other long term (current) drug therapy: Secondary | ICD-10-CM | POA: Insufficient documentation

## 2023-08-01 DIAGNOSIS — Z7982 Long term (current) use of aspirin: Secondary | ICD-10-CM | POA: Diagnosis not present

## 2023-08-01 DIAGNOSIS — I10 Essential (primary) hypertension: Secondary | ICD-10-CM | POA: Insufficient documentation

## 2023-08-01 DIAGNOSIS — R112 Nausea with vomiting, unspecified: Secondary | ICD-10-CM | POA: Diagnosis not present

## 2023-08-01 LAB — CBC WITH DIFFERENTIAL/PLATELET
Abs Immature Granulocytes: 0.05 K/uL (ref 0.00–0.07)
Basophils Absolute: 0.1 K/uL (ref 0.0–0.1)
Basophils Relative: 1 %
Eosinophils Absolute: 0.2 K/uL (ref 0.0–0.5)
Eosinophils Relative: 3 %
HCT: 36.3 % — ABNORMAL LOW (ref 39.0–52.0)
Hemoglobin: 12.7 g/dL — ABNORMAL LOW (ref 13.0–17.0)
Immature Granulocytes: 1 %
Lymphocytes Relative: 21 %
Lymphs Abs: 1.5 K/uL (ref 0.7–4.0)
MCH: 33.3 pg (ref 26.0–34.0)
MCHC: 35 g/dL (ref 30.0–36.0)
MCV: 95.3 fL (ref 80.0–100.0)
Monocytes Absolute: 0.5 K/uL (ref 0.1–1.0)
Monocytes Relative: 6 %
Neutro Abs: 4.8 K/uL (ref 1.7–7.7)
Neutrophils Relative %: 68 %
Platelets: 237 K/uL (ref 150–400)
RBC: 3.81 MIL/uL — ABNORMAL LOW (ref 4.22–5.81)
RDW: 12.6 % (ref 11.5–15.5)
WBC: 7.1 K/uL (ref 4.0–10.5)
nRBC: 0 % (ref 0.0–0.2)

## 2023-08-01 LAB — COMPREHENSIVE METABOLIC PANEL WITH GFR
ALT: 25 U/L (ref 0–44)
AST: 28 U/L (ref 15–41)
Albumin: 4 g/dL (ref 3.5–5.0)
Alkaline Phosphatase: 91 U/L (ref 38–126)
Anion gap: 9 (ref 5–15)
BUN: 17 mg/dL (ref 8–23)
CO2: 24 mmol/L (ref 22–32)
Calcium: 9.2 mg/dL (ref 8.9–10.3)
Chloride: 106 mmol/L (ref 98–111)
Creatinine, Ser: 0.71 mg/dL (ref 0.61–1.24)
GFR, Estimated: >60 mL/min (ref >60)
Glucose, Bld: 110 mg/dL — ABNORMAL HIGH (ref 70–99)
Potassium: 4.6 mmol/L (ref 3.5–5.1)
Sodium: 139 mmol/L (ref 135–145)
Total Bilirubin: 0.8 mg/dL (ref 0.0–1.2)
Total Protein: 6.9 g/dL (ref 6.5–8.1)

## 2023-08-01 LAB — TROPONIN I (HIGH SENSITIVITY)
Troponin I (High Sensitivity): 8 ng/L (ref <18)
Troponin I (High Sensitivity): 8 ng/L (ref <18)

## 2023-08-01 LAB — ETHANOL: Alcohol, Ethyl (B): <15 mg/dL (ref <15)

## 2023-08-01 MED ORDER — SODIUM CHLORIDE 0.9 % IV BOLUS
1000.0000 mL | Freq: Once | INTRAVENOUS | Status: AC
  Administered 2023-08-01: 1000 mL via INTRAVENOUS

## 2023-08-01 MED ORDER — MECLIZINE HCL 25 MG PO TABS: 25.0000 mg | ORAL_TABLET | Freq: Three times a day (TID) | ORAL | 0 refills | Status: AC | PRN

## 2023-08-01 NOTE — ED Provider Notes (Signed)
 [place] EMERGENCY DEPARTMENT AT [facility] Provider Note   CSN: [id number] Arrival date & time: 08/01/23  1057     Patient presents with: Dizziness   Jason Brooks. is a 68 y.o. male.   Patient is a 68 year old male who presents emergency department the chief complaint of ongoing dizziness for approximate the past few months.  Patient notes he did have worsening dizziness morning while defecating.  Patient notes he did have associated nausea and vomiting at that time.  He notes he does feel better on presentation to the emergency department.  He notes he has been noncompliant with his home medications.  He notes he has had no chest pain, shortness of breath, palpitations.  He denies any associated syncope.  He denies any abnormal headaches, pain to neck or back.   Dizziness      Prior to Admission medications   Medication Sig Start Date End Date Taking? Authorizing Provider  albuterol  (PROAIR  HFA) 108 (90 Base) MCG/ACT inhaler 2 puffs every 4 hours as needed only  if your can't catch your breath Patient taking differently: 2 puffs every 4 (four) hours as needed. 2 puffs every 4 hours as needed only  if your can't catch your breath 05/10/21   Darlean Ozell KATHEE, MD  aspirin EC 81 MG tablet Take 81 mg by mouth daily.    [provider]  atorvastatin (LIPITOR) 80 MG tablet Take 80 mg by mouth daily.    [provider]  empagliflozin (JARDIANCE) 10 MG TABS tablet Take 10 mg by mouth daily.    [provider]  ENTRESTO 49-51 MG Take 1 tablet by mouth 2 (two) times daily.    [provider]  furosemide (LASIX) 40 MG tablet Take 40 mg by mouth daily.    [provider]  ibuprofen (ADVIL) 200 MG tablet Take 400-600 mg by mouth every 6 (six) hours as needed for mild pain, moderate pain or headache.    [provider]  metoprolol tartrate (LOPRESSOR) 25 MG tablet Take 25 mg by mouth 2 (two) times daily. 04/28/21   [provider]  nicotine (NICODERM CQ - DOSED IN MG/24 HOURS) 14 mg/24hr patch Place 1 patch onto the skin daily. 05/25/22   [provider]  omeprazole  (PRILOSEC) 20 MG capsule Take 1 capsule (20 mg total) by mouth 2 (two) times daily before a meal. Patient taking differently: Take 20 mg by mouth daily. 06/06/21 03/14/22  Carver, Charles K, DO  spironolactone (ALDACTONE) 25 MG tablet Take 25 mg by mouth daily.    [provider]    Allergies: Borax and Codeine    Review of Systems  Neurological:  Positive for dizziness.  All other systems reviewed and are negative.   Updated Vital Signs BP (!) 187/95 (BP Location: Right Arm)   Pulse (!) 53   Temp 97.7 F (36.5 C) (Oral)   Resp 20   Ht 5' 8 (1.727 m)   Wt 61.3 kg   SpO2 100%   BMI 20.55 kg/m   Physical Exam Vitals and nursing note reviewed.  Constitutional:      Appearance: Normal appearance.  HENT:     Head: Normocephalic and atraumatic.     Nose: Nose normal.     Mouth/Throat:     Mouth: Mucous membranes are moist.  Eyes:     Extraocular Movements: Extraocular movements intact.     Conjunctiva/sclera: Conjunctivae normal.     Pupils: Pupils are equal, round,  and reactive to light.  Cardiovascular:     Rate and Rhythm: Normal rate and regular rhythm.     Pulses: Normal pulses.     Heart sounds: Normal heart sounds. No murmur heard.    No gallop.  Pulmonary:     Effort: Pulmonary effort is normal. No respiratory distress.     Breath sounds: Normal breath sounds. No stridor. No wheezing, rhonchi or rales.  Abdominal:     General: Abdomen is flat. Bowel sounds are normal. There is no distension.     Palpations: Abdomen is soft.     Tenderness: There is no abdominal tenderness. There is no guarding.  Musculoskeletal:        General: Normal range of motion.     Cervical back: Normal range of motion and neck supple. No rigidity or tenderness.  Skin:    General: Skin is warm and dry.     Findings: No  bruising or rash.  Neurological:     General: No focal deficit present.     Mental Status: He is alert and oriented to person, place, and time. Mental status is at baseline.     Cranial Nerves: No cranial nerve deficit.     Sensory: No sensory deficit.     Motor: No weakness.     Coordination: Coordination normal.     Gait: Gait normal.  Psychiatric:        Mood and Affect: Mood normal.        Behavior: Behavior normal.        Thought Content: Thought content normal.        Judgment: Judgment normal.     (all labs ordered are listed, but only abnormal results are displayed) Labs Reviewed  COMPREHENSIVE METABOLIC PANEL WITH GFR  CBC WITH DIFFERENTIAL/PLATELET  URINALYSIS, ROUTINE W REFLEX MICROSCOPIC  ETHANOL  TROPONIN I (HIGH SENSITIVITY)    EKG: None  Radiology: CT Head Wo Contrast Result Date: 08/01/2023 CLINICAL DATA:  68 year old male with syncope, nausea vomiting. EXAM: CT HEAD WITHOUT CONTRAST TECHNIQUE: Contiguous axial images were obtained from the base of the skull through the vertex without intravenous contrast. RADIATION DOSE REDUCTION: This exam was performed according to the departmental dose-optimization program which includes automated exposure control, adjustment of the mA and/or kV according to patient size and/or use of iterative reconstruction technique. COMPARISON:  None Available. FINDINGS: Brain: Some generalized cerebral volume loss, but brainstem and cerebellar atrophy seems disproportionate (series 2, image 11 and series 5, image 28), nonspecific. Patchy small area of chronic encephalomalacia along the right anterior fusiform gyrus (coronal image 30). And small asymmetric regional subarachnoid or parenchymal calcifications there also (sagittal image 14). Patchy additional bilateral cerebral white matter hypodensity. Mild heterogeneity in the deep gray nuclei. No midline shift, ventriculomegaly, mass effect, evidence of mass lesion, intracranial hemorrhage or  evidence of cortically based acute infarction. Vascular: Calcified atherosclerosis at the skull base. No suspicious intracranial vascular hyperdensity. Skull: Intact.  No acute osseous abnormality identified. Sinuses/Orbits: Scattered mild bilateral paranasal sinus mucosal thickening. Tympanic cavities and mastoids are clear. Other: No acute orbit or scalp soft tissue finding identified. IMPRESSION: 1. No acute intracranial abnormality. 2. Nonspecific disproportionate brainstem and cerebellar atrophy is suspected (cerebral Atrophy (ICD10-G31.9)). Evidence of chronic small vessel disease. And anterior right temporal lobe encephalomalacia which might be posttraumatic. Electronically Signed   By: VEAR Hurst M.D.   On: 08/01/2023 12:17     Procedures   Medications Ordered in the ED  sodium chloride  0.9 %  bolus 1,000 mL (has no administration in time range)                                    Medical Decision Making Amount and/or Complexity of Data Reviewed Labs: ordered. Radiology: ordered.   This patient presents to the ED for concern of dizziness differential diagnosis includes CVA, TIA, vertigo, labyrinthitis, space-occupying lesion, Mnire's disease, electrolyte derangement, acute kidney injury, dehydration    Additional history obtained:  Additional history obtained from medical records External records from outside source obtained and reviewed including records   Lab Tests:  I Ordered, and personally interpreted labs.  The pertinent results include: No leukocytosis, anemia at baseline, unremarkable electrolytes, normal liver function and kidney function, negative troponin   Imaging Studies ordered:  I ordered imaging studies including CT scan of head, MRI brain I independently visualized and interpreted imaging which showed no acute intracranial process, no CVA, no ischemic changes, cerebral atrophy I agree with the radiologist interpretation   Medicines ordered and  prescription drug management:  I ordered medication including rectal exam, IV fluids for dizziness Reevaluation of the patient after these medicines showed that the patient improved I have reviewed the patients home medicines and have made adjustments as needed   Problem List / ED Course:  Patient is doing well at this time and is stable for discharge home.  Symptoms have greatly improved with IV fluids.  He had no orthostatic hypotension in the emergency department.  Symptoms are positional in nature and suspect that there is a component of vertigo.  Will provide meclizine  on outpatient basis.  Imaging demonstrated no signs of acute ischemic changes.  He has no focal neurological deficits at this time.  Do not suspect that admission or further workup is warranted at this point.  He has no other acute secondary complaints.  The need for close follow-up with primary care doctor was discussed as well as strict turn precautions for any new or worsening symptoms.  Patient voiced understanding to the plan and had no additional questions.   Social Determinants of Health:  None        Final diagnoses:  None    ED Discharge Orders     None          Daralene Lonni JONETTA DEVONNA 08/01/23 1432    Kommor, Lum, MD 08/02/23 [phone]

## 2023-08-01 NOTE — Discharge Instructions (Signed)
 Please follow-up closely with your primary care doctor on an outpatient basis.  Please take all your home medications as directed.  Return to emergency department immediately for any new or worsening symptoms.

## 2023-08-01 NOTE — ED Triage Notes (Signed)
 Pt states he went to take a shit this am and was throwing up at the same time. States he feels dizzy and doesn't always take his BP meds along with other meds. Cardiac hx., and HTN, and ASAS 81mg  daily.

## 2023-08-01 NOTE — ED Notes (Signed)
 Pt away at scans. Will begin medications when he returns.

## 2023-08-02 ENCOUNTER — Ambulatory Visit (HOSPITAL_COMMUNITY)
Admission: RE | Admit: 2023-08-02 | Discharge: 2023-08-02 | Disposition: A | Discharge status: Home / Self Care | Source: Ambulatory Visit | Attending: Internal Medicine | Admitting: Internal Medicine

## 2023-08-02 DIAGNOSIS — R918 Other nonspecific abnormal finding of lung field: Secondary | ICD-10-CM | POA: Insufficient documentation

## 2023-08-02 MED ORDER — IOHEXOL 350 MG/ML SOLN
75.0000 mL | Freq: Once | INTRAVENOUS | Status: AC | PRN
  Administered 2023-08-02: 75 mL via INTRAVENOUS
